# Patient Record
Sex: Male | Born: 1945 | Race: White | Hispanic: No | Marital: Married
Health system: Southern US, Community
[De-identification: ages and names within clinical notes are randomized; demographics above are authoritative.]

## PROBLEM LIST (undated history)

## (undated) DIAGNOSIS — E785 Hyperlipidemia, unspecified: Secondary | ICD-10-CM

## (undated) DIAGNOSIS — J9621 Acute and chronic respiratory failure with hypoxia: Secondary | ICD-10-CM

## (undated) DIAGNOSIS — J690 Pneumonitis due to inhalation of food and vomit: Secondary | ICD-10-CM

## (undated) DIAGNOSIS — K219 Gastro-esophageal reflux disease without esophagitis: Secondary | ICD-10-CM

## (undated) DIAGNOSIS — I639 Cerebral infarction, unspecified: Secondary | ICD-10-CM

## (undated) HISTORY — PX: TRACHEOSTOMY: SUR1362

---

## 2017-08-01 DIAGNOSIS — I639 Cerebral infarction, unspecified: Secondary | ICD-10-CM | POA: Insufficient documentation

## 2017-08-03 DIAGNOSIS — J690 Pneumonitis due to inhalation of food and vomit: Secondary | ICD-10-CM | POA: Diagnosis present

## 2017-08-10 ENCOUNTER — Other Ambulatory Visit (HOSPITAL_COMMUNITY): Payer: Medicaid Other

## 2017-08-10 ENCOUNTER — Inpatient Hospital Stay
Admission: RE | Admit: 2017-08-10 | Discharge: 2017-09-20 | Disposition: A | Discharge status: Skilled Nursing Facility | Payer: Medicaid Other | Attending: Internal Medicine | Admitting: Internal Medicine

## 2017-08-10 DIAGNOSIS — Z930 Tracheostomy status: Secondary | ICD-10-CM

## 2017-08-10 DIAGNOSIS — R109 Unspecified abdominal pain: Secondary | ICD-10-CM

## 2017-08-10 DIAGNOSIS — Z4659 Encounter for fitting and adjustment of other gastrointestinal appliance and device: Secondary | ICD-10-CM

## 2017-08-10 DIAGNOSIS — Z4689 Encounter for fitting and adjustment of other specified devices: Secondary | ICD-10-CM

## 2017-08-10 DIAGNOSIS — R188 Other ascites: Secondary | ICD-10-CM

## 2017-08-10 DIAGNOSIS — I639 Cerebral infarction, unspecified: Secondary | ICD-10-CM | POA: Diagnosis present

## 2017-08-10 DIAGNOSIS — Z95828 Presence of other vascular implants and grafts: Secondary | ICD-10-CM

## 2017-08-10 DIAGNOSIS — K219 Gastro-esophageal reflux disease without esophagitis: Secondary | ICD-10-CM | POA: Diagnosis present

## 2017-08-10 DIAGNOSIS — Z0189 Encounter for other specified special examinations: Secondary | ICD-10-CM

## 2017-08-10 DIAGNOSIS — Z431 Encounter for attention to gastrostomy: Secondary | ICD-10-CM

## 2017-08-10 DIAGNOSIS — J9621 Acute and chronic respiratory failure with hypoxia: Secondary | ICD-10-CM | POA: Diagnosis present

## 2017-08-10 DIAGNOSIS — K819 Cholecystitis, unspecified: Secondary | ICD-10-CM

## 2017-08-10 DIAGNOSIS — J690 Pneumonitis due to inhalation of food and vomit: Secondary | ICD-10-CM | POA: Diagnosis present

## 2017-08-10 DIAGNOSIS — J9690 Respiratory failure, unspecified, unspecified whether with hypoxia or hypercapnia: Secondary | ICD-10-CM

## 2017-08-10 HISTORY — DX: Gastro-esophageal reflux disease without esophagitis: K21.9

## 2017-08-10 HISTORY — DX: Pneumonitis due to inhalation of food and vomit: J69.0

## 2017-08-10 HISTORY — DX: Hyperlipidemia, unspecified: E78.5

## 2017-08-10 HISTORY — DX: Acute and chronic respiratory failure with hypoxia: J96.21

## 2017-08-10 HISTORY — DX: Cerebral infarction, unspecified: I63.9

## 2017-08-11 LAB — CBC
HEMATOCRIT: 47.1 % (ref 39.0–52.0)
HEMOGLOBIN: 16.1 g/dL (ref 13.0–17.0)
MCH: 30.9 pg (ref 26.0–34.0)
MCHC: 34.2 g/dL (ref 30.0–36.0)
MCV: 90.4 fL (ref 78.0–100.0)
Platelets: 220 10*3/uL (ref 150–400)
RBC: 5.21 MIL/uL (ref 4.22–5.81)
RDW: 13.4 % (ref 11.5–15.5)
WBC: 10.5 10*3/uL (ref 4.0–10.5)

## 2017-08-11 LAB — COMPREHENSIVE METABOLIC PANEL
ALK PHOS: 69 U/L (ref 38–126)
ALT: 29 U/L (ref 17–63)
ANION GAP: 12 (ref 5–15)
AST: 23 U/L (ref 15–41)
Albumin: 3.3 g/dL — ABNORMAL LOW (ref 3.5–5.0)
BUN: 28 mg/dL — AB (ref 6–20)
CALCIUM: 9 mg/dL (ref 8.9–10.3)
CO2: 22 mmol/L (ref 22–32)
Chloride: 106 mmol/L (ref 101–111)
Creatinine, Ser: 0.79 mg/dL (ref 0.61–1.24)
GFR calc Af Amer: >60 mL/min (ref >60)
GFR calc non Af Amer: >60 mL/min (ref >60)
Glucose, Bld: 126 mg/dL — ABNORMAL HIGH (ref 65–99)
Potassium: 3.8 mmol/L (ref 3.5–5.1)
SODIUM: 140 mmol/L (ref 135–145)
Total Bilirubin: 1.1 mg/dL (ref 0.3–1.2)
Total Protein: 6.2 g/dL — ABNORMAL LOW (ref 6.5–8.1)

## 2017-08-11 LAB — PROTIME-INR
INR: 1.09
PROTHROMBIN TIME: 14 s (ref 11.4–15.2)

## 2017-08-12 MED ORDER — PRAVASTATIN SODIUM 40 MG PO TABS
40.00 | ORAL_TABLET | ORAL | Status: DC
Start: 2017-08-10 — End: 2017-08-12

## 2017-08-12 MED ORDER — LIDOCAINE HCL URETHRAL/MUCOSAL 2 % EX GEL
CUTANEOUS | Status: DC
Start: ? — End: 2017-08-12

## 2017-08-12 MED ORDER — HEPARIN SODIUM (PORCINE) 5000 UNIT/ML IJ SOLN
5000.00 | INTRAMUSCULAR | Status: DC
Start: 2017-08-10 — End: 2017-08-12

## 2017-08-12 MED ORDER — SODIUM CHLORIDE 3 % IN NEBU
4.00 | INHALATION_SOLUTION | RESPIRATORY_TRACT | Status: DC
Start: 2017-08-10 — End: 2017-08-12

## 2017-08-12 MED ORDER — FUROSEMIDE 20 MG PO TABS
20.00 | ORAL_TABLET | ORAL | Status: DC
Start: 2017-08-11 — End: 2017-08-12

## 2017-08-12 MED ORDER — POTASSIUM CHLORIDE 20 MEQ/15ML (10%) PO SOLN
20.00 | ORAL | Status: DC
Start: ? — End: 2017-08-12

## 2017-08-12 MED ORDER — ONDANSETRON HCL 4 MG/2ML IJ SOLN
4.00 | INTRAMUSCULAR | Status: DC
Start: ? — End: 2017-08-12

## 2017-08-12 MED ORDER — HYDRALAZINE HCL 20 MG/ML IJ SOLN
10.00 | INTRAMUSCULAR | Status: DC
Start: ? — End: 2017-08-12

## 2017-08-12 MED ORDER — GENERIC EXTERNAL MEDICATION
0.04 | Status: DC
Start: ? — End: 2017-08-12

## 2017-08-12 MED ORDER — LORATADINE 10 MG PO TABS
20.00 | ORAL_TABLET | ORAL | Status: DC
Start: 2017-08-11 — End: 2017-08-12

## 2017-08-12 MED ORDER — VITAMIN D3 25 MCG (1000 UNIT) PO TABS
1000.00 | ORAL_TABLET | ORAL | Status: DC
Start: 2017-08-11 — End: 2017-08-12

## 2017-08-12 MED ORDER — GUAIFENESIN 100 MG/5ML PO LIQD
400.00 | ORAL | Status: DC
Start: 2017-08-10 — End: 2017-08-12

## 2017-08-12 MED ORDER — ACETAMINOPHEN 160 MG/5ML PO SUSP
650.00 | ORAL | Status: DC
Start: ? — End: 2017-08-12

## 2017-08-12 MED ORDER — ASPIRIN 300 MG RE SUPP
300.00 | RECTAL | Status: DC
Start: 2017-08-11 — End: 2017-08-12

## 2017-08-12 MED ORDER — SODIUM CHLORIDE 0.9 % IV SOLN
10.00 | INTRAVENOUS | Status: DC
Start: ? — End: 2017-08-12

## 2017-08-12 MED ORDER — GENERIC EXTERNAL MEDICATION
.08 | Status: DC
Start: ? — End: 2017-08-12

## 2017-08-12 MED ORDER — RANITIDINE HCL 15 MG/ML PO SYRP
150.00 | ORAL_SOLUTION | ORAL | Status: DC
Start: 2017-08-10 — End: 2017-08-12

## 2017-08-12 MED ORDER — ALUM & MAG HYDROXIDE-SIMETH 200-200-20 MG/5ML PO SUSP
30.00 | ORAL | Status: DC
Start: ? — End: 2017-08-12

## 2017-08-12 MED ORDER — POTASSIUM CHLORIDE 20 MEQ/50ML IV SOLN
20.00 | INTRAVENOUS | Status: DC
Start: ? — End: 2017-08-12

## 2017-08-12 MED ORDER — POTASSIUM CHLORIDE CRYS ER 20 MEQ PO TBCR
20.00 | EXTENDED_RELEASE_TABLET | ORAL | Status: DC
Start: ? — End: 2017-08-12

## 2017-08-12 MED ORDER — LABETALOL HCL 5 MG/ML IV SOLN
10.00 | INTRAVENOUS | Status: DC
Start: ? — End: 2017-08-12

## 2017-08-13 ENCOUNTER — Other Ambulatory Visit (HOSPITAL_COMMUNITY): Payer: Medicaid Other

## 2017-08-13 MED ORDER — LIDOCAINE VISCOUS 2 % MT SOLN
OROMUCOSAL | Status: AC
  Filled 2017-08-13: qty 15

## 2017-08-13 MED ORDER — IOPAMIDOL (ISOVUE-300) INJECTION 61%
INTRAVENOUS | Status: AC
  Filled 2017-08-13: qty 50

## 2017-08-15 ENCOUNTER — Other Ambulatory Visit (HOSPITAL_COMMUNITY): Payer: Medicaid Other

## 2017-08-15 LAB — COMPREHENSIVE METABOLIC PANEL
ALT: 82 U/L — ABNORMAL HIGH (ref 17–63)
ANION GAP: 10 (ref 5–15)
AST: 38 U/L (ref 15–41)
Albumin: 2.8 g/dL — ABNORMAL LOW (ref 3.5–5.0)
Alkaline Phosphatase: 77 U/L (ref 38–126)
BUN: 23 mg/dL — ABNORMAL HIGH (ref 6–20)
CHLORIDE: 99 mmol/L — AB (ref 101–111)
CO2: 27 mmol/L (ref 22–32)
Calcium: 8.6 mg/dL — ABNORMAL LOW (ref 8.9–10.3)
Creatinine, Ser: 0.87 mg/dL (ref 0.61–1.24)
GFR calc Af Amer: >60 mL/min (ref >60)
Glucose, Bld: 140 mg/dL — ABNORMAL HIGH (ref 65–99)
POTASSIUM: 3.1 mmol/L — AB (ref 3.5–5.1)
SODIUM: 136 mmol/L (ref 135–145)
Total Bilirubin: 0.7 mg/dL (ref 0.3–1.2)
Total Protein: 6.2 g/dL — ABNORMAL LOW (ref 6.5–8.1)

## 2017-08-15 LAB — TRIGLYCERIDES: TRIGLYCERIDES: 88 mg/dL (ref <150)

## 2017-08-16 LAB — COMPREHENSIVE METABOLIC PANEL
ALT: 110 U/L — AB (ref 17–63)
AST: 58 U/L — ABNORMAL HIGH (ref 15–41)
Albumin: 2.6 g/dL — ABNORMAL LOW (ref 3.5–5.0)
Alkaline Phosphatase: 85 U/L (ref 38–126)
Anion gap: 9 (ref 5–15)
BUN: 21 mg/dL — ABNORMAL HIGH (ref 6–20)
CHLORIDE: 100 mmol/L — AB (ref 101–111)
CO2: 28 mmol/L (ref 22–32)
CREATININE: 0.81 mg/dL (ref 0.61–1.24)
Calcium: 8.7 mg/dL — ABNORMAL LOW (ref 8.9–10.3)
GFR calc non Af Amer: >60 mL/min (ref >60)
Glucose, Bld: 143 mg/dL — ABNORMAL HIGH (ref 65–99)
POTASSIUM: 3.8 mmol/L (ref 3.5–5.1)
Sodium: 137 mmol/L (ref 135–145)
Total Bilirubin: 1.1 mg/dL (ref 0.3–1.2)
Total Protein: 5.9 g/dL — ABNORMAL LOW (ref 6.5–8.1)

## 2017-08-16 LAB — POTASSIUM: Potassium: 4 mmol/L (ref 3.5–5.1)

## 2017-08-16 LAB — CBC
HEMATOCRIT: 43.1 % (ref 39.0–52.0)
Hemoglobin: 14.8 g/dL (ref 13.0–17.0)
MCH: 31 pg (ref 26.0–34.0)
MCHC: 34.3 g/dL (ref 30.0–36.0)
MCV: 90.2 fL (ref 78.0–100.0)
PLATELETS: 272 10*3/uL (ref 150–400)
RBC: 4.78 MIL/uL (ref 4.22–5.81)
RDW: 13.1 % (ref 11.5–15.5)
WBC: 8 10*3/uL (ref 4.0–10.5)

## 2017-08-16 LAB — PROTIME-INR
INR: 1.16
Prothrombin Time: 14.7 seconds (ref 11.4–15.2)

## 2017-08-16 LAB — GAMMA GT: GGT: 64 U/L — AB (ref 7–50)

## 2017-08-16 MED ORDER — FENTANYL CITRATE (PF) 100 MCG/2ML IJ SOLN
INTRAMUSCULAR | Status: AC
  Filled 2017-08-16: qty 2

## 2017-08-16 MED ORDER — MIDAZOLAM HCL 2 MG/2ML IJ SOLN
INTRAMUSCULAR | Status: AC
  Filled 2017-08-16: qty 2

## 2017-08-16 NOTE — Progress Notes (Signed)
Called RN to inform him that we will not get pt due to time constraints in IR schedule. Informed RN that we will attempt to bring pt down tomorrow (5/8) for g tube placement.

## 2017-08-16 NOTE — Consult Note (Signed)
Chief Complaint: Patient was seen in consultation today for percutaneous gastric tub placement at the request of Dr Ardeth Sportsman  Supervising Physician: Ruel Favors  Patient Status: Select IP  History of Present Illness: Isaac Abbott is a 72 y.o. male   CVA Dysphagia Malnutrition Need for long term care Request for percutaneous gastric tube placement Approved by Dr Garnet Koyanagi  No past medical history on file.  Allergies: Patient has no allergy information on record.  Medications: Prior to Admission medications   Not on File     No family history on file.  Social History   Socioeconomic History  . Marital status: Not on file    Spouse name: Not on file  . Number of children: Not on file  . Years of education: Not on file  . Highest education level: Not on file  Occupational History  . Not on file  Social Needs  . Financial resource strain: Not on file  . Food insecurity:    Worry: Not on file    Inability: Not on file  . Transportation needs:    Medical: Not on file    Non-medical: Not on file  Tobacco Use  . Smoking status: Not on file  Substance and Sexual Activity  . Alcohol use: Not on file  . Drug use: Not on file  . Sexual activity: Not on file  Lifestyle  . Physical activity:    Days per week: Not on file    Minutes per session: Not on file  . Stress: Not on file  Relationships  . Social connections:    Talks on phone: Not on file    Gets together: Not on file    Attends religious service: Not on file    Active member of club or organization: Not on file    Attends meetings of clubs or organizations: Not on file    Relationship status: Not on file  Other Topics Concern  . Not on file  Social History Narrative  . Not on file    Review of Systems: A 12 point ROS discussed and pertinent positives are indicated in the HPI above.  All other systems are negative.  Review of Systems  Constitutional: Positive for activity change and  appetite change. Negative for fever.  Cardiovascular: Negative for chest pain.  Neurological: Positive for weakness.  Psychiatric/Behavioral: Positive for confusion.    Vital Signs: There were no vitals taken for this visit.  Physical Exam  Cardiovascular: Normal rate and regular rhythm.  Pulmonary/Chest: He has wheezes.  Abdominal: Soft. Bowel sounds are normal.  Musculoskeletal:  Pt tries to communicate Using "thumbs up" sign for answers  Neurological: He is alert.  Skin: Skin is warm and dry.  Psychiatric: He has a normal mood and affect.  Consented with wife at bedside  Nursing note and vitals reviewed.   Imaging: Ct Abdomen Wo Contrast  Result Date: 08/15/2017 CLINICAL DATA:  72 year old male with dysphagia and suspected aspiration undergoing evaluation for possible percutaneous gastrostomy tube placement. Evaluate anatomy. EXAM: CT ABDOMEN WITHOUT CONTRAST TECHNIQUE: Multidetector CT imaging of the abdomen was performed following the standard protocol without IV contrast. COMPARISON:  None. FINDINGS: Lower chest: Calcifications are present throughout the coronary arteries. The heart is normal in size. No pericardial effusion. Small hiatal hernia. Debris is present within the bronchus intermedius. Additionally, there are patchy areas of secretions and/or debris within the right lower lobe segmental bronchi. Patchy airspace opacity is present in the posterior aspect of the right  upper, middle and lower lobes and to a lesser degree in the posterior aspect of the left lower lobe. Findings are concerning for aspiration. No suspicious pulmonary mass or nodule. No significant pleural effusion. Hepatobiliary: Normal hepatic contour and morphology. No discrete hepatic lesions. Normal appearance of the gallbladder. No intra or extrahepatic biliary ductal dilatation. Pancreas: Unremarkable. No pancreatic ductal dilatation or surrounding inflammatory changes. Spleen: Normal in size without focal  abnormality. Adrenals/Urinary Tract: Adrenal glands are unremarkable. Kidneys are normal, without renal calculi, focal lesion, or hydronephrosis. Stomach/Bowel: Normal gastric anatomy. Small periampullary duodenal diverticulum. Colonic diverticular disease without CT evidence of active inflammation. Normal position of the transverse colon inferior to the stomach. Vascular/Lymphatic: Limited evaluation in the absence of intravenous contrast. Atherosclerotic calcifications throughout the abdominal aorta. Suspect bilateral, left greater than right common iliac stenosis. No evidence of aneurysm. No suspicious lymphadenopathy. Other: No abdominal wall hernia or abnormality. Musculoskeletal: No acute or significant osseous findings. IMPRESSION: 1. Anatomy is amenable to percutaneous gastrostomy tube placement. 2. Multifocal patchy airspace opacity and bronchial debris, particularly in the right lower lobe concerning for aspiration. 3. Colonic diverticular disease without CT evidence of active inflammation. 4. Coronary artery calcifications. 5.  Aortic Atherosclerosis (ICD10-170.0) 6. Suspect bilateral, left greater than right, common iliac artery stenoses. Signed, Sterling Big, MD [facility] Electronically Signed   By: Malachy Moan M.D.   On: 08/15/2017 13:47   Dg Chest Port 1 View  Result Date: 08/13/2017 CLINICAL DATA:  PICC line placement EXAM: PORTABLE CHEST 1 VIEW COMPARISON:  08/10/2017 FINDINGS: Right upper extremity central venous catheter tip overlies the cavoatrial region. Removal of esophageal tube and left-sided central venous catheter. Stable enlarged cardiomediastinal silhouette. Patchy right greater than left lower lung infiltrates. Aortic atherosclerosis. No pneumothorax. IMPRESSION: 1. Right upper extremity catheter tip overlies the cavoatrial region 2. Right greater than left bilateral lower lung pulmonary infiltrates.  Electronically Signed   By: Jasmine Pang M.D.   On: 08/13/2017 20:58   Dg Chest Port 1 View  Result Date: 08/10/2017 CLINICAL DATA:  Shortness of breath, congestion EXAM: PORTABLE CHEST 1 VIEW COMPARISON:  None. FINDINGS: Mild bilateral lower lobe opacities, likely atelectasis. No frank interstitial edema. No pleural effusion or pneumothorax. Cardiomegaly. Right arm PICC terminates at the cavoatrial junction. Enteric tube courses into the duodenum. A catheter/tube overlies the left chest, this is favored to reflect the external portion of the enteric tube. IMPRESSION: Mild bilateral lower lobe opacities, likely atelectasis. Electronically Signed   By: Charline Bills M.D.   On: 08/10/2017 18:35   Dg Abd Portable 1v  Result Date: 08/10/2017 CLINICAL DATA:  Evaluate NG tube position EXAM: PORTABLE ABDOMEN - 1 VIEW COMPARISON:  None. FINDINGS: Enteric tube terminates in the proximal 3rd portion of the duodenum. Nonobstructive bowel gas pattern. Contrast in the left colon. IMPRESSION: Enteric tube terminates in the proximal 3rd portion of the duodenum. Electronically Signed   By: Charline Bills M.D.   On: 08/10/2017 18:34   Dg Vangie Bicker G Tube Plc W/fl-no Rad  Result Date: 08/13/2017 CLINICAL DATA:  Inpatient. Post pyloric feeding tube placement requested. EXAM: DG NASO G TUBE PLC W/FL-NO RAD CONTRAST:  None. FLUOROSCOPY TIME:  Fluoroscopy Time:  5 minutes 0 seconds Radiation Exposure Index (if provided by the fluoroscopic device): 37.5 mGy Number of Acquired Spot Images: 0 COMPARISON:  08/10/2017 chest and abdomen radiograph FINDINGS: Despite multiple attempts in multiple patient positions utilizing multiple maneuvers including chin tuck and wet sponge, and  utilizing a wire stiffener, nasoenteric tube placement was technically unsuccessful, as the tube entered the trachea on all attempts. IMPRESSION: Technically unsuccessful attempted nasoenteric tube placement. These results were called by telephone at the  time of interpretation on 08/13/2017 at 4:04 pm to Dr. Carron Curie , who verbally acknowledged these results. Electronically Signed   By: Delbert Phenix M.D.   On: 08/13/2017 16:17    Labs:  CBC: Recent Labs    08/11/17 0656  WBC 10.5  HGB 16.1  HCT 47.1  PLT 220    COAGS: Recent Labs    08/11/17 0656  INR 1.09    BMP: Recent Labs    08/11/17 0656 08/15/17 0509  NA 140 136  K 3.8 3.1*  CL 106 99*  CO2 22 27  GLUCOSE 126* 140*  BUN 28* 23*  CALCIUM 9.0 8.6*  CREATININE 0.79 0.87  GFRNONAA >60 >60  GFRAA >60 >60    LIVER FUNCTION TESTS: Recent Labs    08/11/17 0656 08/15/17 0509  BILITOT 1.1 0.7  AST 23 38  ALT 29 82*  ALKPHOS 69 77  PROT 6.2* 6.2*  ALBUMIN 3.3* 2.8*    TUMOR MARKERS: No results for input(s): AFPTM, CEA, CA199, CHROMGRNA in the last 8760 hours.  Assessment and Plan:  CVA/dysphagia Malnutrition Deconditioning Long term care Scheduled for percutaneous gastric tube placement in IR 5/7 Risks and benefits discussed with the patient and wife including, but not limited to the need for a barium enema during the procedure, bleeding, infection, peritonitis, or damage to adjacent structures.  All of their questions were answered, patient is agreeable to proceed. Consent signed and in chart.   Thank you for this interesting consult.  I greatly enjoyed meeting Saturnino Liew and look forward to participating in their care.  A copy of this report was sent to the requesting provider on this date.  Electronically Signed: Robet Leu, PA-C 08/16/2017, 6:06 AM   I spent a total of 40 Minutes    in face to face in clinical consultation, greater than 50% of which was counseling/coordinating care for percutaneous gastric tube placement

## 2017-08-17 ENCOUNTER — Encounter (HOSPITAL_COMMUNITY): Payer: Self-pay | Admitting: Interventional Radiology

## 2017-08-17 ENCOUNTER — Other Ambulatory Visit (HOSPITAL_COMMUNITY): Payer: Medicaid Other

## 2017-08-17 HISTORY — PX: IR GASTROSTOMY TUBE MOD SED: IMG625

## 2017-08-17 LAB — COMPREHENSIVE METABOLIC PANEL
ALBUMIN: 2.8 g/dL — AB (ref 3.5–5.0)
ALT: 132 U/L — AB (ref 17–63)
ANION GAP: 8 (ref 5–15)
AST: 57 U/L — ABNORMAL HIGH (ref 15–41)
Alkaline Phosphatase: 95 U/L (ref 38–126)
BUN: 19 mg/dL (ref 6–20)
CALCIUM: 8.8 mg/dL — AB (ref 8.9–10.3)
CO2: 29 mmol/L (ref 22–32)
CREATININE: 0.81 mg/dL (ref 0.61–1.24)
Chloride: 100 mmol/L — ABNORMAL LOW (ref 101–111)
GFR calc Af Amer: >60 mL/min (ref >60)
GFR calc non Af Amer: >60 mL/min (ref >60)
Glucose, Bld: 128 mg/dL — ABNORMAL HIGH (ref 65–99)
Potassium: 4.1 mmol/L (ref 3.5–5.1)
SODIUM: 137 mmol/L (ref 135–145)
TOTAL PROTEIN: 6.2 g/dL — AB (ref 6.5–8.1)
Total Bilirubin: 1 mg/dL (ref 0.3–1.2)

## 2017-08-17 MED ORDER — IOPAMIDOL (ISOVUE-300) INJECTION 61%
INTRAVENOUS | Status: AC
  Administered 2017-08-17: 10 mL
  Filled 2017-08-17: qty 50

## 2017-08-17 MED ORDER — CEFAZOLIN SODIUM-DEXTROSE 2-4 GM/100ML-% IV SOLN
2.0000 g | Freq: Once | INTRAVENOUS | Status: AC
  Administered 2017-08-17: 2 g via INTRAVENOUS
  Filled 2017-08-17: qty 100

## 2017-08-17 MED ORDER — LIDOCAINE HCL (PF) 1 % IJ SOLN
INTRAMUSCULAR | Status: AC | PRN
  Administered 2017-08-17: 5 mL

## 2017-08-17 MED ORDER — FENTANYL CITRATE (PF) 100 MCG/2ML IJ SOLN
INTRAMUSCULAR | Status: AC
  Filled 2017-08-17: qty 2

## 2017-08-17 MED ORDER — FENTANYL CITRATE (PF) 100 MCG/2ML IJ SOLN
INTRAMUSCULAR | Status: AC | PRN
  Administered 2017-08-17: 50 ug via INTRAVENOUS

## 2017-08-17 MED ORDER — MIDAZOLAM HCL 2 MG/2ML IJ SOLN
INTRAMUSCULAR | Status: AC
  Filled 2017-08-17: qty 2

## 2017-08-17 MED ORDER — MIDAZOLAM HCL 2 MG/2ML IJ SOLN
INTRAMUSCULAR | Status: AC | PRN
  Administered 2017-08-17 (×2): 1 mg via INTRAVENOUS

## 2017-08-17 MED ORDER — GLUCAGON HCL RDNA (DIAGNOSTIC) 1 MG IJ SOLR
INTRAMUSCULAR | Status: AC
  Filled 2017-08-17: qty 1

## 2017-08-17 MED ORDER — GLUCAGON HCL RDNA (DIAGNOSTIC) 1 MG IJ SOLR
INTRAMUSCULAR | Status: AC | PRN
  Administered 2017-08-17: 1 mg via INTRAVENOUS

## 2017-08-17 MED ORDER — LIDOCAINE HCL 1 % IJ SOLN
INTRAMUSCULAR | Status: AC
  Filled 2017-08-17: qty 20

## 2017-08-17 NOTE — Sedation Documentation (Signed)
Patient is resting comfortably. 

## 2017-08-17 NOTE — Procedures (Signed)
18 Fr balloon retention G tube EBL 0 Comp 0

## 2017-08-17 NOTE — Sedation Documentation (Addendum)
Patient is resting comfortably. 

## 2017-08-18 ENCOUNTER — Other Ambulatory Visit (HOSPITAL_COMMUNITY): Payer: Medicaid Other

## 2017-08-20 LAB — HEPATIC FUNCTION PANEL
ALK PHOS: 146 U/L — AB (ref 38–126)
ALT: 155 U/L — AB (ref 17–63)
AST: 59 U/L — ABNORMAL HIGH (ref 15–41)
Albumin: 2.8 g/dL — ABNORMAL LOW (ref 3.5–5.0)
BILIRUBIN DIRECT: 0.2 mg/dL (ref 0.1–0.5)
BILIRUBIN INDIRECT: 0.5 mg/dL (ref 0.3–0.9)
BILIRUBIN TOTAL: 0.7 mg/dL (ref 0.3–1.2)
Total Protein: 6.3 g/dL — ABNORMAL LOW (ref 6.5–8.1)

## 2017-08-20 LAB — URINALYSIS, ROUTINE W REFLEX MICROSCOPIC
Bacteria, UA: NONE SEEN
Bilirubin Urine: NEGATIVE
GLUCOSE, UA: NEGATIVE mg/dL
KETONES UR: NEGATIVE mg/dL
Leukocytes, UA: NEGATIVE
Nitrite: NEGATIVE
PH: 6 (ref 5.0–8.0)
Protein, ur: NEGATIVE mg/dL
SPECIFIC GRAVITY, URINE: 1.015 (ref 1.005–1.030)

## 2017-08-21 ENCOUNTER — Encounter: Payer: Self-pay | Admitting: Certified Registered Nurse Anesthetist

## 2017-08-21 NOTE — Anesthesia Preprocedure Evaluation (Addendum)
Anesthesia Evaluation  Patient identified by MRN, date of birth, ID band Patient confused    Reviewed: Allergy & Precautions, NPO status , Patient's Chart, lab work & pertinent test results  History of Anesthesia Complications Negative for: history of anesthetic complications  Airway Mallampati: II  TM Distance: >3 FB Neck ROM: Full    Dental  (+) Edentulous Upper, Edentulous Lower   Pulmonary  Resp failure self extubated, chronic aspiration   + rhonchi        Cardiovascular hypertension, Normal cardiovascular exam     Neuro/Psych CVA negative neurological ROS  negative psych ROS   GI/Hepatic Neg liver ROS, Gastric tube   Endo/Other  negative endocrine ROS  Renal/GU negative Renal ROS     Musculoskeletal negative musculoskeletal ROS (+)   Abdominal   Peds  Hematology negative hematology ROS (+)   Anesthesia Other Findings Day of surgery medications reviewed with the patient.  Reproductive/Obstetrics                            Anesthesia Physical Anesthesia Plan  ASA: IV  Anesthesia Plan: General   Post-op Pain Management:    Induction: Intravenous  PONV Risk Score and Plan: Treatment may vary due to age or medical condition  Airway Management Planned: Oral ETT  Additional Equipment:   Intra-op Plan:   Post-operative Plan: Post-operative intubation/ventilation  Informed Consent: I have reviewed the patients History and Physical, chart, labs and discussed the procedure including the risks, benefits and alternatives for the proposed anesthesia with the patient or authorized representative who has indicated his/her understanding and acceptance.   Consent reviewed with POA  Plan Discussed with: CRNA and Anesthesiologist  Anesthesia Plan Comments:        Anesthesia Quick Evaluation

## 2017-08-22 ENCOUNTER — Encounter (HOSPITAL_COMMUNITY): Payer: Medicaid Other | Admitting: Certified Registered Nurse Anesthetist

## 2017-08-22 ENCOUNTER — Encounter: Payer: Self-pay | Admitting: Internal Medicine

## 2017-08-22 ENCOUNTER — Encounter
Admission: RE | Disposition: A | Discharge status: Skilled Nursing Facility | Payer: Self-pay | Source: Home / Self Care | Attending: Internal Medicine

## 2017-08-22 DIAGNOSIS — I6359 Cerebral infarction due to unspecified occlusion or stenosis of other cerebral artery: Secondary | ICD-10-CM

## 2017-08-22 DIAGNOSIS — Z930 Tracheostomy status: Secondary | ICD-10-CM

## 2017-08-22 DIAGNOSIS — J690 Pneumonitis due to inhalation of food and vomit: Secondary | ICD-10-CM

## 2017-08-22 DIAGNOSIS — K219 Gastro-esophageal reflux disease without esophagitis: Secondary | ICD-10-CM | POA: Diagnosis present

## 2017-08-22 DIAGNOSIS — J9621 Acute and chronic respiratory failure with hypoxia: Secondary | ICD-10-CM | POA: Diagnosis not present

## 2017-08-22 DIAGNOSIS — I639 Cerebral infarction, unspecified: Secondary | ICD-10-CM | POA: Diagnosis present

## 2017-08-22 HISTORY — PX: TRACHEOSTOMY TUBE PLACEMENT: SHX814

## 2017-08-22 LAB — COMPREHENSIVE METABOLIC PANEL
ALBUMIN: 3 g/dL — AB (ref 3.5–5.0)
ALT: 123 U/L — AB (ref 17–63)
AST: 49 U/L — AB (ref 15–41)
Alkaline Phosphatase: 127 U/L — ABNORMAL HIGH (ref 38–126)
Anion gap: 10 (ref 5–15)
BILIRUBIN TOTAL: 0.7 mg/dL (ref 0.3–1.2)
BUN: 18 mg/dL (ref 6–20)
CHLORIDE: 99 mmol/L — AB (ref 101–111)
CO2: 27 mmol/L (ref 22–32)
Calcium: 9.3 mg/dL (ref 8.9–10.3)
Creatinine, Ser: 0.89 mg/dL (ref 0.61–1.24)
GFR calc Af Amer: >60 mL/min (ref >60)
GLUCOSE: 110 mg/dL — AB (ref 65–99)
POTASSIUM: 4.8 mmol/L (ref 3.5–5.1)
Sodium: 136 mmol/L (ref 135–145)
TOTAL PROTEIN: 6.4 g/dL — AB (ref 6.5–8.1)

## 2017-08-22 SURGERY — CREATION, TRACHEOSTOMY
Anesthesia: General

## 2017-08-22 MED ORDER — LIDOCAINE-EPINEPHRINE 1 %-1:100000 IJ SOLN
INTRAMUSCULAR | Status: AC
Start: 2017-08-22 — End: ?
  Filled 2017-08-22: qty 1

## 2017-08-22 MED ORDER — PROPOFOL 10 MG/ML IV BOLUS
INTRAVENOUS | Status: DC | PRN
  Administered 2017-08-22: 110 mg via INTRAVENOUS

## 2017-08-22 MED ORDER — SUGAMMADEX SODIUM 200 MG/2ML IV SOLN
INTRAVENOUS | Status: DC | PRN
  Administered 2017-08-22: 200 mg via INTRAVENOUS

## 2017-08-22 MED ORDER — EPHEDRINE SULFATE 50 MG/ML IJ SOLN
INTRAMUSCULAR | Status: AC
  Filled 2017-08-22: qty 1

## 2017-08-22 MED ORDER — LIDOCAINE-EPINEPHRINE 1 %-1:100000 IJ SOLN
INTRAMUSCULAR | Status: DC | PRN
  Administered 2017-08-22: 5.5 mL

## 2017-08-22 MED ORDER — LACTATED RINGERS IV SOLN
INTRAVENOUS | Status: DC | PRN
  Administered 2017-08-22: 07:00:00 via INTRAVENOUS

## 2017-08-22 MED ORDER — 0.9 % SODIUM CHLORIDE (POUR BTL) OPTIME
TOPICAL | Status: DC | PRN
  Administered 2017-08-22: 1000 mL

## 2017-08-22 MED ORDER — SUCCINYLCHOLINE CHLORIDE 200 MG/10ML IV SOSY
PREFILLED_SYRINGE | INTRAVENOUS | Status: DC | PRN
  Administered 2017-08-22: 120 mg via INTRAVENOUS

## 2017-08-22 MED ORDER — DEXAMETHASONE SODIUM PHOSPHATE 10 MG/ML IJ SOLN
INTRAMUSCULAR | Status: DC | PRN
  Administered 2017-08-22: 10 mg via INTRAVENOUS

## 2017-08-22 MED ORDER — FENTANYL CITRATE (PF) 250 MCG/5ML IJ SOLN
INTRAMUSCULAR | Status: AC
  Filled 2017-08-22: qty 5

## 2017-08-22 MED ORDER — LIDOCAINE HCL (CARDIAC) PF 100 MG/5ML IV SOSY
PREFILLED_SYRINGE | INTRAVENOUS | Status: DC | PRN
  Administered 2017-08-22: 60 mg via INTRAVENOUS

## 2017-08-22 MED ORDER — MIDAZOLAM HCL 2 MG/2ML IJ SOLN
INTRAMUSCULAR | Status: AC
Start: 2017-08-22 — End: ?
  Filled 2017-08-22: qty 2

## 2017-08-22 MED ORDER — ROCURONIUM BROMIDE 100 MG/10ML IV SOLN
INTRAVENOUS | Status: DC | PRN
  Administered 2017-08-22: 50 mg via INTRAVENOUS

## 2017-08-22 MED ORDER — ONDANSETRON HCL 4 MG/2ML IJ SOLN
INTRAMUSCULAR | Status: DC | PRN
  Administered 2017-08-22: 4 mg via INTRAVENOUS

## 2017-08-22 MED ORDER — PROPOFOL 10 MG/ML IV BOLUS
INTRAVENOUS | Status: AC
  Filled 2017-08-22: qty 20

## 2017-08-22 MED ORDER — FENTANYL CITRATE (PF) 100 MCG/2ML IJ SOLN
INTRAMUSCULAR | Status: DC | PRN
  Administered 2017-08-22: 50 ug via INTRAVENOUS

## 2017-08-22 MED ORDER — ROCURONIUM BROMIDE 50 MG/5ML IV SOLN
INTRAVENOUS | Status: AC
  Filled 2017-08-22: qty 1

## 2017-08-22 SURGICAL SUPPLY — 38 items
ATTRACTOMAT 16X20 MAGNETIC DRP (DRAPES) IMPLANT
BLADE SURG 15 STRL LF DISP TIS (BLADE) ×1 IMPLANT
BLADE SURG 15 STRL SS (BLADE) ×2
CLEANER TIP ELECTROSURG 2X2 (MISCELLANEOUS) ×3 IMPLANT
COVER SURGICAL LIGHT HANDLE (MISCELLANEOUS) ×3 IMPLANT
DRAPE HALF SHEET 40X57 (DRAPES) IMPLANT
ELECT COATED BLADE 2.86 ST (ELECTRODE) ×3 IMPLANT
ELECT REM PT RETURN 9FT ADLT (ELECTROSURGICAL) ×3
ELECTRODE REM PT RTRN 9FT ADLT (ELECTROSURGICAL) ×1 IMPLANT
GAUZE SPONGE 4X4 16PLY XRAY LF (GAUZE/BANDAGES/DRESSINGS) ×3 IMPLANT
GEL ULTRASOUND 20GR AQUASONIC (MISCELLANEOUS) ×3 IMPLANT
GLOVE SS BIOGEL STRL SZ 7.5 (GLOVE) ×1 IMPLANT
GLOVE SUPERSENSE BIOGEL SZ 7.5 (GLOVE) ×2
GOWN STRL REUS W/ TWL LRG LVL3 (GOWN DISPOSABLE) ×1 IMPLANT
GOWN STRL REUS W/ TWL XL LVL3 (GOWN DISPOSABLE) ×1 IMPLANT
GOWN STRL REUS W/TWL LRG LVL3 (GOWN DISPOSABLE) ×2
GOWN STRL REUS W/TWL XL LVL3 (GOWN DISPOSABLE) ×2
HOLDER TRACH TUBE VELCRO 19.5 (MISCELLANEOUS) ×3 IMPLANT
KIT BASIN OR (CUSTOM PROCEDURE TRAY) ×3 IMPLANT
KIT SUCTION CATH 14FR (SUCTIONS) ×3 IMPLANT
KIT TURNOVER KIT B (KITS) ×3 IMPLANT
NEEDLE HYPO 25GX1X1/2 BEV (NEEDLE) ×3 IMPLANT
NS IRRIG 1000ML POUR BTL (IV SOLUTION) ×3 IMPLANT
PACK EENT II TURBAN DRAPE (CUSTOM PROCEDURE TRAY) ×3 IMPLANT
PAD ARMBOARD 7.5X6 YLW CONV (MISCELLANEOUS) IMPLANT
PENCIL BUTTON HOLSTER BLD 10FT (ELECTRODE) ×3 IMPLANT
SPONGE DRAIN TRACH 4X4 STRL 2S (GAUZE/BANDAGES/DRESSINGS) ×3 IMPLANT
SPONGE INTESTINAL PEANUT (DISPOSABLE) ×3 IMPLANT
SUT SILK 2 0 SH CR/8 (SUTURE) ×3 IMPLANT
SUT SILK 3 0 TIES 10X30 (SUTURE) IMPLANT
SYR 5ML LUER SLIP (SYRINGE) ×3 IMPLANT
SYR CONTROL 10ML LL (SYRINGE) ×3 IMPLANT
TOWEL OR 17X24 6PK STRL BLUE (TOWEL DISPOSABLE) ×3 IMPLANT
TOWEL OR 17X26 10 PK STRL BLUE (TOWEL DISPOSABLE) ×3 IMPLANT
TUBE CONNECTING 12'X1/4 (SUCTIONS) ×1
TUBE CONNECTING 12X1/4 (SUCTIONS) ×2 IMPLANT
TUBE TRACH SHILEY  6 DIST  CUF (TUBING) ×3 IMPLANT
TUBE TRACH SHILEY 8 DIST CUF (TUBING) IMPLANT

## 2017-08-22 NOTE — Op Note (Signed)
Isaac Abbott, PACITTI MEDICAL RECORD [id number] ACCOUNT [id number] DATE OF BIRTH:03/29/1946 FACILITY: [facility] LOCATION: [facility] PHYSICIAN:Kayla Weekes Braxton Feathers, MD  OPERATIVE REPORT  DATE OF PROCEDURE:  08/22/2017  PREOPERATIVE DIAGNOSIS:  Respiratory failure.  POSTOPERATIVE DIAGNOSIS:  Respiratory failure.  OPERATION:  Tracheotomy with a #6 Shiley cuffed tube.  SURGEON:  Narda Bonds, MD  ANESTHESIA:  General endotracheal.  COMPLICATIONS:  None.  ESTIMATED BLOOD LOSS:  Minimal.  BRIEF CLINICAL NOTE:  The patient is a 72 year old gentleman who has had previous history of strokes.  He recently had a stroke about a month ago requiring admission to outside hospital.  He had an aspiration pneumonia requiring intubation.  He has had  difficulty protecting his airway with aspiration problems as well as airway problems.  He has a history of chronic AFib and COPD.  He was subsequently transferred to [facility].  He has again been having chronic problems with protecting  his airway and it was recommended he undergo a tracheotomy.  He is presently not intubated, but is having excessive secretions with trouble protecting his airway.  He has been unable to swallow and takes everything through a gastrostomy tube.  He is  taken to the operating room at this time for tracheotomy.  DESCRIPTION OF PROCEDURE:  The patient was brought to the operating room and underwent general endotracheal anesthesia.  The neck was marked and prepped with Betadine solution.  The proposed incision site was injected with 5-6 mL of Xylocaine with  epinephrine for hemostasis and local anesthetic.  A vertical incision was made just below the cricoid cartilage.  Dissection was carried down through the subcutaneous tissue with cautery.  The strap muscles were identified and divided longitudinally in  the midline and retracted laterally.  The patient had a fairly low cricoid cartilage and the thyroid isthmus  laid over the first and second tracheal rings.  The thyroid isthmus was divided with the cautery.  First 2 tracheal rings were exposed.  Cricoid  hook was used to elevate the trachea slightly.  A horizontal tracheotomy was performed through the first and second tracheal rings.  A small mid portion of the second tracheal ring was removed and the #6 Shiley tracheotomy tube was inserted without  difficulty.  The patient was ventilated well with good sats.  The tracheotomy tube was secured with silk sutures x4 and a Velcro trach collar around the neck.    The patient did well with this and was subsequently transferred back to [facility].  AN/NUANCE  D:08/22/2017 T:08/22/2017 JOB:[id number]

## 2017-08-22 NOTE — Brief Op Note (Signed)
08/22/2017  8:31 AM  PATIENT:  Isaac Abbott  72 y.o. male  PRE-OPERATIVE DIAGNOSIS:  RESPIRATORY FAILURE  POST-OPERATIVE DIAGNOSIS:  RESPIRATORY FAILURE  PROCEDURE:  Procedure(s): TRACHEOSTOMY (N/A)  # 6 Cuffed Shiley  SURGEON:  Surgeon(s) and Role:    Drema Halon, MD - Primary  PHYSICIAN ASSISTANT:   ASSISTANTS: none   ANESTHESIA:   general  EBL:  minimal   BLOOD ADMINISTERED:none  DRAINS: none   LOCAL MEDICATIONS USED:  XYLOCAINE with EPI 6 cc  SPECIMEN:  No Specimen  DISPOSITION OF SPECIMEN:  N/A  COUNTS:  YES  TOURNIQUET:  * No tourniquets in log *  DICTATION: .Other Dictation: Dictation Number [id number]  PLAN OF CARE: Discharge to home after PACU  PATIENT DISPOSITION:  PACU - hemodynamically stable.   Delay start of Pharmacological VTE agent (>24hrs) due to surgical blood loss or risk of bleeding: yes

## 2017-08-22 NOTE — Anesthesia Procedure Notes (Signed)
Procedure Name: Intubation Date/Time: 08/22/2017 7:41 AM Performed by: Candis Shine, CRNA Pre-anesthesia Checklist: Patient identified, Emergency Drugs available, Suction available and Patient being monitored Patient Re-evaluated:Patient Re-evaluated prior to induction Oxygen Delivery Method: Circle System Utilized Preoxygenation: Pre-oxygenation with 100% oxygen Induction Type: IV induction Laryngoscope Size: Mac and 4 Grade View: Grade I Tube type: Oral Tube size: 7.0 mm Number of attempts: 1 Airway Equipment and Method: Stylet and Oral airway Placement Confirmation: ETT inserted through vocal cords under direct vision,  positive ETCO2 and breath sounds checked- equal and bilateral Secured at: 23 cm Tube secured with: Tape Dental Injury: Teeth and Oropharynx as per pre-operative assessment

## 2017-08-22 NOTE — Interval H&P Note (Signed)
History and Physical Interval Note:  08/22/2017 7:36 AM  Isaac Abbott  has presented today for surgery, with the diagnosis of RESPRITORY FAILURE  The various methods of treatment have been discussed with the patient and family. After consideration of risks, benefits and other options for treatment, the patient has consented to  Procedure(s): TRACHEOSTOMY (N/A) as a surgical intervention .  The patient's history has been reviewed, patient examined, no change in status, stable for surgery.  I have reviewed the patient's chart and labs.  Questions were answered to the patient's satisfaction.     [name]

## 2017-08-22 NOTE — H&P (Signed)
PREOPERATIVE H&P  Chief Complaint: Respiratory failure  [name] is a 72 y.o. male who presents for evaluation of respiratory failure. Patient has history of several strokes with history of aspiration and pneumonia with poor airway protection. Previous history of COPD and chronic Afib. He has been intubated and extubated several times and trach was recommended. He's taken to the OR for tracheostomy.  History reviewed. No pertinent past medical history. Past Surgical History:  Procedure Laterality Date  . IR GASTROSTOMY TUBE MOD SED  08/17/2017   Social History   Socioeconomic History  . Marital status: Not on file    Spouse name: Not on file  . Number of children: Not on file  . Years of education: Not on file  . Highest education level: Not on file  Occupational History  . Not on file  Social Needs  . Financial resource strain: Not on file  . Food insecurity:    Worry: Not on file    Inability: Not on file  . Transportation needs:    Medical: Not on file    Non-medical: Not on file  Tobacco Use  . Smoking status: Not on file  Substance and Sexual Activity  . Alcohol use: Not on file  . Drug use: Not on file  . Sexual activity: Not on file  Lifestyle  . Physical activity:    Days per week: Not on file    Minutes per session: Not on file  . Stress: Not on file  Relationships  . Social connections:    Talks on phone: Not on file    Gets together: Not on file    Attends religious service: Not on file    Active member of club or organization: Not on file    Attends meetings of clubs or organizations: Not on file    Relationship status: Not on file  Other Topics Concern  . Not on file  Social History Narrative  . Not on file   History reviewed. No pertinent family history. Not on File Prior to Admission medications   Not on File     Positive ROS: neg  All other systems have been reviewed and were otherwise negative with the exception of those mentioned  in the HPI and as above.  Physical Exam: Vitals:   08/17/17 1100 08/17/17 1110  BP: (!) 143/88 (!) 160/76  Pulse: 96 87  Resp: 18 (!) 22  SpO2: 95% 96%    General: Alert, no acute distress Oral: normal oropharynx Nasal: Clear nasal passages Neck: No palpable adenopathy or thyroid nodules. Trach midline Cardiovascular: Irregular rate and rhythm, no murmur.  Respiratory: Clear to auscultation Neurologic: Alert and oriented x 3   Assessment/Plan: RESPRITORY FAILURE Plan for Procedure(s): TRACHEOSTOMY   [name], MD 08/22/2017 7:31 AM

## 2017-08-22 NOTE — Anesthesia Postprocedure Evaluation (Addendum)
Anesthesia Post Note  Patient: Isaac Abbott  Procedure(s) Performed: TRACHEOSTOMY (N/A )     Patient location during evaluation: Other Anesthesia Type: General Level of consciousness: sedated Pain management: pain level controlled Vital Signs Assessment: post-procedure vital signs reviewed and stable Respiratory status: spontaneous breathing and respiratory function stable Cardiovascular status: stable Postop Assessment: no apparent nausea or vomiting Anesthetic complications: no    Last Vitals:  Vitals:   08/17/17 1100 08/17/17 1110  BP: (!) 143/88 (!) 160/76  Pulse: 96 87  Resp: 18 (!) 22  SpO2: 95% 96%             Jamelle Goldston DANIEL

## 2017-08-22 NOTE — Transfer of Care (Signed)
Immediate Anesthesia Transfer of Care Note  Patient: Isaac Abbott  Procedure(s) Performed: TRACHEOSTOMY (N/A )  Patient Location: [facility] room 7  Anesthesia Type:General  Level of Consciousness: Patient remains intubated per anesthesia plan  Airway & Oxygen Therapy: Patient placed on Ventilator (see vital sign flow sheet for setting)  Post-op Assessment: Report given to RN and Post -op Vital signs reviewed and stable  Post vital signs: Reviewed and stable  Last Vitals:  Vitals Value Taken Time  BP    Temp    Pulse    Resp    SpO2      Last Pain: There were no vitals filed for this visit.       Complications: No apparent anesthesia complications

## 2017-08-22 NOTE — Consult Note (Signed)
Pulmonary Critical Care Medicine [facility] PULMONARY SERVICE  Date of Service: 08/22/2017  PULMONARY CONSULT   Isaac Abbott  ZOX:[id number]  DOB: Apr 03, 1946   DOA: 08/10/2017  Referring Physician: Carron Curie, MD  HPI: Isaac Abbott is a 72 y.o. male seen for follow up of Acute on Chronic Respiratory Failure.  Patient presented with an acute CVA involving the left side.  Patient was transferred to the facility however was not able to be given TPA because the patient was outside the time window.  Neurology saw the patient and recommended anticoagulation therapy.  Patient on presentation was in respiratory distress had to be intubated.  Patient was found at that time to have a aspiration pneumonia.  Patient also underwent a PEG tube placement because of dysphagia the patient presented to this facility was noted to have very much excessive secretions.  He was off of the ventilator.  He was treated conservatively with nebulized treatments and pulmonary toilet.  Also was given antibiotics for presumed infection.  Clinically patient deteriorated and just underwent for tracheostomy for basically airway protection  Review of Systems:  ROS performed and is unremarkable other than noted above.  Past Medical History:  Diagnosis Date  . Acute on chronic respiratory failure with hypoxia (HCC)   . Aspiration pneumonia (HCC)   . GERD (gastroesophageal reflux disease)   . Hyperlipidemia   . Stroke (cerebrum) [facility])     Past Surgical History:  Procedure Laterality Date  . IR GASTROSTOMY TUBE MOD SED  08/17/2017    Social History:    has no tobacco, alcohol, and drug history on file.  Family History: Non-Contributory to the present illness  Not on File  Medications: Reviewed on Rounds  Physical Exam:  Vitals: Temperature 98.1 pulse 76 respiratory rate 22 blood pressure 154/78 saturations 100%  Ventilator Settings mode of ventilation assist control FiO2 35% tidal volume 500 PEEP  5  . General: Comfortable at this time . Eyes: Grossly normal lids, irises & conjunctiva . ENT: grossly tongue is normal . Neck: no obvious mass . Cardiovascular: S1-S2 normal no gallop or rub . Respiratory: Scattered rhonchi coarse breath sounds . Abdomen: Soft nontender . Skin: no rash seen on limited exam . Musculoskeletal: not rigid . Psychiatric:unable to assess . Neurologic: no seizure no involuntary movements         Labs on Admission:  Basic Metabolic Panel: Recent Labs  Lab 08/16/17 0557 08/16/17 1420 08/17/17 0651 08/22/17 0541  NA 137  --  137 136  K 3.8 4.0 4.1 4.8  CL 100*  --  100* 99*  CO2 28  --  29 27  GLUCOSE 143*  --  128* 110*  BUN 21*  --  19 18  CREATININE 0.81  --  0.81 0.89  CALCIUM 8.7*  --  8.8* 9.3    Liver Function Tests: Recent Labs  Lab 08/16/17 0557 08/17/17 0651 08/20/17 0634 08/22/17 0541  AST 58* 57* 59* 49*  ALT 110* 132* 155* 123*  ALKPHOS 85 95 146* 127*  BILITOT 1.1 1.0 0.7 0.7  PROT 5.9* 6.2* 6.3* 6.4*  ALBUMIN 2.6* 2.8* 2.8* 3.0*   No results for input(s): LIPASE, AMYLASE in the last 168 hours. No results for input(s): AMMONIA in the last 168 hours.  CBC: Recent Labs  Lab 08/16/17 0557  WBC 8.0  HGB 14.8  HCT 43.1  MCV 90.2  PLT 272    Cardiac Enzymes: No results for input(s): CKTOTAL, CKMB, CKMBINDEX, TROPONINI in the last  168 hours.  BNP (last 3 results) No results for input(s): BNP in the last 8760 hours.  ProBNP (last 3 results) No results for input(s): PROBNP in the last 8760 hours.   Radiological Exams on Admission: No results found.  Assessment/Plan Active Problems:   Aspiration pneumonia (HCC)   Acute on chronic respiratory failure with hypoxia (HCC)   GERD (gastroesophageal reflux disease)   Stroke (cerebrum) (HCC)   Tracheostomy status (HCC)   1. Acute on chronic respiratory failure with hypoxia patient is now status post tracheostomy will continue with full vent support at this time  we will continue pulmonary toilet secretion management hopefully will be able to advance to T collar trials. 2. Stroke continue with present management restorative therapy 3. GERD at baseline we will continue monitoring 4. Pneumonia due to aspiration will need aspiration precautions 5. Status post tracheostomy continue with supportive care  I have personally seen and evaluated the patient, evaluated laboratory and imaging results, formulated the assessment and plan and placed orders. The Patient requires high complexity decision making for assessment and support.  Case was discussed on Rounds with the Respiratory Therapy Staff Time Spent  Yevonne Pax, MD [facility] Pulmonary Critical Care Medicine Sleep Medicine

## 2017-08-23 ENCOUNTER — Encounter (HOSPITAL_COMMUNITY): Payer: Self-pay | Admitting: Otolaryngology

## 2017-08-23 DIAGNOSIS — K219 Gastro-esophageal reflux disease without esophagitis: Secondary | ICD-10-CM | POA: Diagnosis not present

## 2017-08-23 DIAGNOSIS — J9621 Acute and chronic respiratory failure with hypoxia: Secondary | ICD-10-CM | POA: Diagnosis not present

## 2017-08-23 DIAGNOSIS — I6359 Cerebral infarction due to unspecified occlusion or stenosis of other cerebral artery: Secondary | ICD-10-CM | POA: Diagnosis not present

## 2017-08-23 DIAGNOSIS — J690 Pneumonitis due to inhalation of food and vomit: Secondary | ICD-10-CM | POA: Diagnosis not present

## 2017-08-23 NOTE — Progress Notes (Signed)
Pulmonary Critical Care Medicine [facility] [place]   PULMONARY SERVICE  PROGRESS NOTE  Date of Service: 08/23/2017  Singleton Hickox  ZOX:[id number]  DOB: 1946/03/11   DOA: 08/10/2017  Referring Physician: Carron Curie, MD  HPI: Marcellis Frampton is a 72 y.o. male seen for follow up of Acute on Chronic Respiratory Failure.  Patient did about 4 hours weaning on pressure support looks comfortable we should be able to hopefully advance further.  Right now is in the resting lordosis control at this time.  Medications: Reviewed on Rounds  Physical Exam:  Vitals: Temperature 97.6 pulse 107 respiratory rate 20 blood pressure 145/62 saturations 93%  Ventilator Settings mode of ventilation assist control FiO2 35% tidal volume 554 PEEP of 5  . General: Comfortable at this time . Eyes: Grossly normal lids, irises & conjunctiva . ENT: grossly tongue is normal . Neck: no obvious mass . Cardiovascular: S1-S2 normal no gallop or rub . Respiratory: No rhonchi expansion is equal . Abdomen: Soft nontender . Skin: no rash seen on limited exam . Musculoskeletal: not rigid . Psychiatric:unable to assess . Neurologic: no seizure no involuntary movements         Labs on Admission:  Basic Metabolic Panel: Recent Labs  Lab 08/17/17 0651 08/22/17 0541  NA 137 136  K 4.1 4.8  CL 100* 99*  CO2 29 27  GLUCOSE 128* 110*  BUN 19 18  CREATININE 0.81 0.89  CALCIUM 8.8* 9.3    Liver Function Tests: Recent Labs  Lab 08/17/17 0651 08/20/17 0634 08/22/17 0541  AST 57* 59* 49*  ALT 132* 155* 123*  ALKPHOS 95 146* 127*  BILITOT 1.0 0.7 0.7  PROT 6.2* 6.3* 6.4*  ALBUMIN 2.8* 2.8* 3.0*   No results for input(s): LIPASE, AMYLASE in the last 168 hours. No results for input(s): AMMONIA in the last 168 hours.  CBC: No results for input(s): WBC, NEUTROABS, HGB, HCT, MCV, PLT in the last 168 hours.  Cardiac Enzymes: No results for input(s): CKTOTAL, CKMB, CKMBINDEX, TROPONINI in the last  168 hours.  BNP (last 3 results) No results for input(s): BNP in the last 8760 hours.  ProBNP (last 3 results) No results for input(s): PROBNP in the last 8760 hours.  Radiological Exams on Admission: No results found.  Assessment/Plan Active Problems:   Aspiration pneumonia (HCC)   Acute on chronic respiratory failure with hypoxia (HCC)   GERD (gastroesophageal reflux disease)   Stroke (cerebrum) (HCC)   Tracheostomy status (HCC)   1. Acute on chronic respiratory failure with hypoxia we will continue with weaning on pressure support mode as tolerated continue pulmonary toilet secretion management supportive care 2. Pneumonia due to aspiration right now is comfortable we will continue to monitor x-rays as necessary. 3. GERD at baseline we will continue supportive care 4. Stroke restorative therapy physical therapy as needed. 5. Tracheostomy status had tracheostomy done for airway issues   I have personally seen and evaluated the patient, evaluated laboratory and imaging results, formulated the assessment and plan and placed orders. The Patient requires high complexity decision making for assessment and support.  Case was discussed on Rounds with the Respiratory Therapy Staff  Yevonne Pax, MD [facility] Pulmonary Critical Care Medicine Sleep Medicine

## 2017-08-24 DIAGNOSIS — J690 Pneumonitis due to inhalation of food and vomit: Secondary | ICD-10-CM | POA: Diagnosis not present

## 2017-08-24 DIAGNOSIS — K219 Gastro-esophageal reflux disease without esophagitis: Secondary | ICD-10-CM | POA: Diagnosis not present

## 2017-08-24 DIAGNOSIS — I6359 Cerebral infarction due to unspecified occlusion or stenosis of other cerebral artery: Secondary | ICD-10-CM | POA: Diagnosis not present

## 2017-08-24 DIAGNOSIS — J9621 Acute and chronic respiratory failure with hypoxia: Secondary | ICD-10-CM | POA: Diagnosis not present

## 2017-08-24 NOTE — Progress Notes (Signed)
Pulmonary Critical Care Medicine [facility] [place]   PULMONARY SERVICE  PROGRESS NOTE  Date of Service: 08/24/2017  Isaac Abbott  ZOX:[id number]  DOB: [birth year]   DOA: 08/10/2017  Referring Physician: Carron Curie, MD  HPI: Isaac Abbott is a 72 y.o. male seen for follow up of Acute on Chronic Respiratory Failure.  Patient is weaning right now on pressure support mode the goal is for about 8 hours.  Looks comfortable right now no distress is noted  Medications: Reviewed on Rounds  Physical Exam:  Vitals: Temperature 98.2 pulse 103 respiratory rate 18 blood pressure 107/63 saturations 98%  Ventilator Settings mode of ventilation pressure support FiO2 35% tidal volume 580 pressure support 12 PEEP 5  . General: Comfortable at this time . Eyes: Grossly normal lids, irises & conjunctiva . ENT: grossly tongue is normal . Neck: no obvious mass . Cardiovascular: S1-S2 normal no gallop or rub . Respiratory: No rhonchi expansion is equal . Abdomen: Soft nontender . Skin: no rash seen on limited exam . Musculoskeletal: not rigid . Psychiatric:unable to assess . Neurologic: no seizure no involuntary movements         Labs on Admission:  Basic Metabolic Panel: Recent Labs  Lab 08/22/17 0541  NA 136  K 4.8  CL 99*  CO2 27  GLUCOSE 110*  BUN 18  CREATININE 0.89  CALCIUM 9.3    Liver Function Tests: Recent Labs  Lab 08/20/17 0634 08/22/17 0541  AST 59* 49*  ALT 155* 123*  ALKPHOS 146* 127*  BILITOT 0.7 0.7  PROT 6.3* 6.4*  ALBUMIN 2.8* 3.0*   No results for input(s): LIPASE, AMYLASE in the last 168 hours. No results for input(s): AMMONIA in the last 168 hours.  CBC: No results for input(s): WBC, NEUTROABS, HGB, HCT, MCV, PLT in the last 168 hours.  Cardiac Enzymes: No results for input(s): CKTOTAL, CKMB, CKMBINDEX, TROPONINI in the last 168 hours.  BNP (last 3 results) No results for input(s): BNP in the last 8760 hours.  ProBNP (last 3  results) No results for input(s): PROBNP in the last 8760 hours.  Radiological Exams on Admission: No results found.  Assessment/Plan Active Problems:   Aspiration pneumonia (HCC)   Acute on chronic respiratory failure with hypoxia (HCC)   GERD (gastroesophageal reflux disease)   Stroke (cerebrum) (HCC)   Tracheostomy status (HCC)   1. Acute on chronic respiratory failure with hypoxia we will continue with pressure support mode now weaning right now we will continue to advance to wean as tolerated continue secretion management. 2. Pneumonia due to aspiration clinically treated we will follow along follow x-rays as necessary 3. GERD stable 4. Stroke at baseline continue with restorative therapy 5. Status post tracheostomy we will continue to monitor   I have personally seen and evaluated the patient, evaluated laboratory and imaging results, formulated the assessment and plan and placed orders. The Patient requires high complexity decision making for assessment and support.  Case was discussed on Rounds with the Respiratory Therapy Staff  Yevonne Pax, MD [facility] Pulmonary Critical Care Medicine Sleep Medicine

## 2017-08-25 DIAGNOSIS — I6359 Cerebral infarction due to unspecified occlusion or stenosis of other cerebral artery: Secondary | ICD-10-CM | POA: Diagnosis not present

## 2017-08-25 DIAGNOSIS — J690 Pneumonitis due to inhalation of food and vomit: Secondary | ICD-10-CM | POA: Diagnosis not present

## 2017-08-25 DIAGNOSIS — K219 Gastro-esophageal reflux disease without esophagitis: Secondary | ICD-10-CM | POA: Diagnosis not present

## 2017-08-25 DIAGNOSIS — J9621 Acute and chronic respiratory failure with hypoxia: Secondary | ICD-10-CM | POA: Diagnosis not present

## 2017-08-25 LAB — COMPREHENSIVE METABOLIC PANEL
ALT: 259 U/L — ABNORMAL HIGH (ref 17–63)
ANION GAP: 12 (ref 5–15)
AST: 42 U/L — AB (ref 15–41)
Albumin: 2.9 g/dL — ABNORMAL LOW (ref 3.5–5.0)
Alkaline Phosphatase: 289 U/L — ABNORMAL HIGH (ref 38–126)
BILIRUBIN TOTAL: 1.2 mg/dL (ref 0.3–1.2)
BUN: 22 mg/dL — AB (ref 6–20)
CO2: 29 mmol/L (ref 22–32)
Calcium: 9 mg/dL (ref 8.9–10.3)
Chloride: 93 mmol/L — ABNORMAL LOW (ref 101–111)
Creatinine, Ser: 0.79 mg/dL (ref 0.61–1.24)
GFR calc Af Amer: >60 mL/min (ref >60)
Glucose, Bld: 117 mg/dL — ABNORMAL HIGH (ref 65–99)
POTASSIUM: 4.2 mmol/L (ref 3.5–5.1)
Sodium: 134 mmol/L — ABNORMAL LOW (ref 135–145)
TOTAL PROTEIN: 6.2 g/dL — AB (ref 6.5–8.1)

## 2017-08-25 NOTE — Progress Notes (Signed)
Pulmonary Critical Care Medicine [facility] [place]   PULMONARY SERVICE  PROGRESS NOTE  Date of Service: 08/25/2017  [name]  UJW:[id number]  DOB: Mar 20, 1946   DOA: 08/10/2017  Referring Physician: Carron Curie, MD  HPI: Isaac Abbott is a 72 y.o. male seen for follow up of Acute on Chronic Respiratory Failure.  Weaning on pressure support today looks good so far goal is for about 12 hours  Medications: Reviewed on Rounds  Physical Exam:  Vitals: Temperature 98.7 pulse 97 respiratory rate 23 blood pressure 127/65  Ventilator Settings mode of ventilation pressure support FiO2 35% tidal volume 684 per support 12 PEEP 5  . General: Comfortable at this time . Eyes: Grossly normal lids, irises & conjunctiva . ENT: grossly tongue is normal . Neck: no obvious mass . Cardiovascular: S1-S2 normal no gallop or rub . Respiratory: No rhonchi expansion is equal . Abdomen: Soft nontender . Skin: no rash seen on limited exam . Musculoskeletal: not rigid . Psychiatric:unable to assess . Neurologic: no seizure no involuntary movements         Labs on Admission:  Basic Metabolic Panel: Recent Labs  Lab 08/22/17 0541 08/25/17 0639  NA 136 134*  K 4.8 4.2  CL 99* 93*  CO2 27 29  GLUCOSE 110* 117*  BUN 18 22*  CREATININE 0.89 0.79  CALCIUM 9.3 9.0    Liver Function Tests: Recent Labs  Lab 08/20/17 0634 08/22/17 0541 08/25/17 0639  AST 59* 49* 42*  ALT 155* 123* 259*  ALKPHOS 146* 127* 289*  BILITOT 0.7 0.7 1.2  PROT 6.3* 6.4* 6.2*  ALBUMIN 2.8* 3.0* 2.9*   No results for input(s): LIPASE, AMYLASE in the last 168 hours. No results for input(s): AMMONIA in the last 168 hours.  CBC: No results for input(s): WBC, NEUTROABS, HGB, HCT, MCV, PLT in the last 168 hours.  Cardiac Enzymes: No results for input(s): CKTOTAL, CKMB, CKMBINDEX, TROPONINI in the last 168 hours.  BNP (last 3 results) No results for input(s): BNP in the last 8760 hours.  ProBNP (last  3 results) No results for input(s): PROBNP in the last 8760 hours.  Radiological Exams on Admission: No results found.  Assessment/Plan Active Problems:   Aspiration pneumonia (HCC)   Acute on chronic respiratory failure with hypoxia (HCC)   GERD (gastroesophageal reflux disease)   Stroke (cerebrum) (HCC)   Tracheostomy status (HCC)   1. Acute on chronic respiratory failure with hypoxia currently is doing well weaning continue to advance the weaning on pressure support mode as mentioned above the goal is for 12 hours we will continue with supportive care 2. GERD at baseline we will continue to monitor 3. Pneumonia due to aspiration treated with antibiotics we will follow-up x-rays as needed 4. Stroke at baseline continue restorative therapy 5. Tracheostomy working towards capping and decannulation hopefully   I have personally seen and evaluated the patient, evaluated laboratory and imaging results, formulated the assessment and plan and placed orders. The Patient requires high complexity decision making for assessment and support.  Case was discussed on Rounds with the Respiratory Therapy Staff  Yevonne Pax, MD [facility] Pulmonary Critical Care Medicine Sleep Medicine

## 2017-08-26 DIAGNOSIS — J9621 Acute and chronic respiratory failure with hypoxia: Secondary | ICD-10-CM | POA: Diagnosis not present

## 2017-08-26 DIAGNOSIS — K219 Gastro-esophageal reflux disease without esophagitis: Secondary | ICD-10-CM | POA: Diagnosis not present

## 2017-08-26 DIAGNOSIS — J690 Pneumonitis due to inhalation of food and vomit: Secondary | ICD-10-CM | POA: Diagnosis not present

## 2017-08-26 DIAGNOSIS — I6359 Cerebral infarction due to unspecified occlusion or stenosis of other cerebral artery: Secondary | ICD-10-CM | POA: Diagnosis not present

## 2017-08-26 NOTE — Progress Notes (Signed)
Pulmonary Critical Care Medicine [facility] [place]   PULMONARY SERVICE  PROGRESS NOTE  Date of Service: 08/26/2017  Isaac Abbott  [id number]  DOB: [birth year]   DOA: 08/10/2017  Referring Physician: Carron Curie, MD  HPI: Isaac Abbott is a 72 y.o. male seen for follow up of Acute on Chronic Respiratory Failure.  Right now patient is on pressure support mode has been on 35% oxygen good saturations.  The goal is for 18 hours but I think will be expanded to as tolerated since he has excellent tidal volumes noted.  Medications: Reviewed on Rounds  Physical Exam:  Vitals: Temperature 96.6 pulse 87 respiratory rate 16 blood pressure 130/85 saturations 99%  Ventilator Settings mode of ventilation pressure support FiO2 35% tidal volume 701 pressure support 12/5  . General: Comfortable at this time . Eyes: Grossly normal lids, irises & conjunctiva . ENT: grossly tongue is normal . Neck: no obvious mass . Cardiovascular: S1-S2 normal no gallop or rub . Respiratory: No rhonchi coarse breath sounds . Abdomen: Soft nontender . Skin: no rash seen on limited exam . Musculoskeletal: not rigid . Psychiatric:unable to assess . Neurologic: no seizure no involuntary movements         Labs on Admission:  Basic Metabolic Panel: Recent Labs  Lab 08/22/17 0541 08/25/17 0639  NA 136 134*  K 4.8 4.2  CL 99* 93*  CO2 27 29  GLUCOSE 110* 117*  BUN 18 22*  CREATININE 0.89 0.79  CALCIUM 9.3 9.0    Liver Function Tests: Recent Labs  Lab 08/20/17 0634 08/22/17 0541 08/25/17 0639  AST 59* 49* 42*  ALT 155* 123* 259*  ALKPHOS 146* 127* 289*  BILITOT 0.7 0.7 1.2  PROT 6.3* 6.4* 6.2*  ALBUMIN 2.8* 3.0* 2.9*   No results for input(s): LIPASE, AMYLASE in the last 168 hours. No results for input(s): AMMONIA in the last 168 hours.  CBC: No results for input(s): WBC, NEUTROABS, HGB, HCT, MCV, PLT in the last 168 hours.  Cardiac Enzymes: No results for input(s): CKTOTAL,  CKMB, CKMBINDEX, TROPONINI in the last 168 hours.  BNP (last 3 results) No results for input(s): BNP in the last 8760 hours.  ProBNP (last 3 results) No results for input(s): PROBNP in the last 8760 hours.  Radiological Exams on Admission: No results found.  Assessment/Plan Active Problems:   Aspiration pneumonia (HCC)   Acute on chronic respiratory failure with hypoxia (HCC)   GERD (gastroesophageal reflux disease)   Stroke (cerebrum) (HCC)   Tracheostomy status (HCC)   1. Acute on chronic respiratory failure with hypoxia patient is weaning will continue to advance to wean on pressure support and we are changing the order to as tolerated at this time.  We will go ahead and wean for 24 hours and then hopefully begin T collar trial soon. 2. GERD stable at this time continue to monitor 3. Pneumonia due to aspiration clinically improving we will continue to follow 4. Stroke patient's at baseline 5. Tracheostomy working towards air/T collar   I have personally seen and evaluated the patient, evaluated laboratory and imaging results, formulated the assessment and plan and placed orders. The Patient requires high complexity decision making for assessment and support.  Case was discussed on Rounds with the Respiratory Therapy Staff  Yevonne Pax, MD [facility] Pulmonary Critical Care Medicine Sleep Medicine

## 2017-08-27 DIAGNOSIS — I6359 Cerebral infarction due to unspecified occlusion or stenosis of other cerebral artery: Secondary | ICD-10-CM | POA: Diagnosis not present

## 2017-08-27 DIAGNOSIS — K219 Gastro-esophageal reflux disease without esophagitis: Secondary | ICD-10-CM | POA: Diagnosis not present

## 2017-08-27 DIAGNOSIS — J9621 Acute and chronic respiratory failure with hypoxia: Secondary | ICD-10-CM | POA: Diagnosis not present

## 2017-08-27 DIAGNOSIS — J690 Pneumonitis due to inhalation of food and vomit: Secondary | ICD-10-CM | POA: Diagnosis not present

## 2017-08-27 NOTE — Progress Notes (Signed)
Pulmonary Critical Care Medicine [facility] [place]   PULMONARY SERVICE  PROGRESS NOTE  Date of Service: 08/27/2017  Isaac Abbott  OZH:[id number]  DOB: 11-Jul-1945   DOA: 08/10/2017  Referring Physician: Carron Curie, MD  HPI: Isaac Abbott is a 72 y.o. male seen for follow up of Acute on Chronic Respiratory Failure.  He is doing very well sitting up in a chair he is awake appears to be appropriate.  Right now is on T collar has been tolerating it well  Medications: Reviewed on Rounds  Physical Exam:  Vitals: Temperature 97.7 pulse 85 respiratory rate 18 blood pressure 140/67 saturations 94%  Ventilator Settings off of the ventilator on T collar  . General: Comfortable at this time . Eyes: Grossly normal lids, irises & conjunctiva . ENT: grossly tongue is normal . Neck: no obvious mass . Cardiovascular: S1 S2 normal no gallop . Respiratory: Good air entry bilaterally no rhonchi . Abdomen: soft . Skin: no rash seen on limited exam . Musculoskeletal: not rigid . Psychiatric:unable to assess . Neurologic: no seizure no involuntary movements         Labs on Admission:  Basic Metabolic Panel: Recent Labs  Lab 08/22/17 0541 08/25/17 0639  NA 136 134*  K 4.8 4.2  CL 99* 93*  CO2 27 29  GLUCOSE 110* 117*  BUN 18 22*  CREATININE 0.89 0.79  CALCIUM 9.3 9.0    Liver Function Tests: Recent Labs  Lab 08/22/17 0541 08/25/17 0639  AST 49* 42*  ALT 123* 259*  ALKPHOS 127* 289*  BILITOT 0.7 1.2  PROT 6.4* 6.2*  ALBUMIN 3.0* 2.9*   No results for input(s): LIPASE, AMYLASE in the last 168 hours. No results for input(s): AMMONIA in the last 168 hours.  CBC: No results for input(s): WBC, NEUTROABS, HGB, HCT, MCV, PLT in the last 168 hours.  Cardiac Enzymes: No results for input(s): CKTOTAL, CKMB, CKMBINDEX, TROPONINI in the last 168 hours.  BNP (last 3 results) No results for input(s): BNP in the last 8760 hours.  ProBNP (last 3 results) No results for  input(s): PROBNP in the last 8760 hours.  Radiological Exams on Admission: No results found.  Assessment/Plan Active Problems:   Aspiration pneumonia (HCC)   Acute on chronic respiratory failure with hypoxia (HCC)   GERD (gastroesophageal reflux disease)   Stroke (cerebrum) (HCC)   Tracheostomy status (HCC)   1. Acute on chronic respiratory failure with hypoxia we will continue weaning on T collar trials patient is tolerating it fairly well at this time. 2. Aspiration pneumonia treated clinically stable monitor x-rays as necessary 3. GERD stable we will continue to follow 4. Stroke at baseline continue with physical therapy 5. Tracheostomy status stable at this time   I have personally seen and evaluated the patient, evaluated laboratory and imaging results, formulated the assessment and plan and placed orders. The Patient requires high complexity decision making for assessment and support.  Case was discussed on Rounds with the Respiratory Therapy Staff  Yevonne Pax, MD [facility] Pulmonary Critical Care Medicine Sleep Medicine

## 2017-08-28 LAB — PROTIME-INR
INR: 1.04
PROTHROMBIN TIME: 13.5 s (ref 11.4–15.2)

## 2017-08-29 DIAGNOSIS — I6359 Cerebral infarction due to unspecified occlusion or stenosis of other cerebral artery: Secondary | ICD-10-CM | POA: Diagnosis not present

## 2017-08-29 DIAGNOSIS — J9621 Acute and chronic respiratory failure with hypoxia: Secondary | ICD-10-CM | POA: Diagnosis not present

## 2017-08-29 DIAGNOSIS — K219 Gastro-esophageal reflux disease without esophagitis: Secondary | ICD-10-CM | POA: Diagnosis not present

## 2017-08-29 DIAGNOSIS — J690 Pneumonitis due to inhalation of food and vomit: Secondary | ICD-10-CM | POA: Diagnosis not present

## 2017-08-29 LAB — COMPREHENSIVE METABOLIC PANEL
ALT: 191 U/L — ABNORMAL HIGH (ref 17–63)
ANION GAP: 11 (ref 5–15)
AST: 46 U/L — ABNORMAL HIGH (ref 15–41)
Albumin: 2.3 g/dL — ABNORMAL LOW (ref 3.5–5.0)
Alkaline Phosphatase: 370 U/L — ABNORMAL HIGH (ref 38–126)
BILIRUBIN TOTAL: 1.1 mg/dL (ref 0.3–1.2)
BUN: 18 mg/dL (ref 6–20)
CO2: 30 mmol/L (ref 22–32)
Calcium: 8.4 mg/dL — ABNORMAL LOW (ref 8.9–10.3)
Chloride: 91 mmol/L — ABNORMAL LOW (ref 101–111)
Creatinine, Ser: 0.79 mg/dL (ref 0.61–1.24)
Glucose, Bld: 158 mg/dL — ABNORMAL HIGH (ref 65–99)
POTASSIUM: 3.7 mmol/L (ref 3.5–5.1)
Sodium: 132 mmol/L — ABNORMAL LOW (ref 135–145)
TOTAL PROTEIN: 5.8 g/dL — AB (ref 6.5–8.1)

## 2017-08-29 LAB — CBC
HEMATOCRIT: 40.5 % (ref 39.0–52.0)
HEMOGLOBIN: 13.6 g/dL (ref 13.0–17.0)
MCH: 30.4 pg (ref 26.0–34.0)
MCHC: 33.6 g/dL (ref 30.0–36.0)
MCV: 90.4 fL (ref 78.0–100.0)
Platelets: 263 10*3/uL (ref 150–400)
RBC: 4.48 MIL/uL (ref 4.22–5.81)
RDW: 12.9 % (ref 11.5–15.5)
WBC: 5.7 10*3/uL (ref 4.0–10.5)

## 2017-08-29 NOTE — Progress Notes (Signed)
Pulmonary Critical Care Medicine [facility] [place]   PULMONARY SERVICE  PROGRESS NOTE  Date of Service: 08/29/2017  Isaac Abbott  JYN:[id number]  DOB: Nov 26, 1945   DOA: 08/10/2017  Referring Physician: Carron Curie, MD  HPI: Isaac Abbott is a 72 y.o. male seen for follow up of Acute on Chronic Respiratory Failure.  Patient remains on T collar has been doing fairly well has been off the ventilator for more than 48 hours at this stage  Medications: Reviewed on Rounds  Physical Exam:  Vitals: Temperature 97.5 pulse 78 respiratory rate 18 blood pressure 139/64 saturations 93%  Ventilator Settings off the ventilator on T collar FiO2 28%  . General: Comfortable at this time . Eyes: Grossly normal lids, irises & conjunctiva . ENT: grossly tongue is normal . Neck: no obvious mass . Cardiovascular: S1 S2 normal no gallop . Respiratory: No rhonchi expansion is equal . Abdomen: soft . Skin: no rash seen on limited exam . Musculoskeletal: not rigid . Psychiatric:unable to assess . Neurologic: no seizure no involuntary movements         Labs on Admission:  Basic Metabolic Panel: Recent Labs  Lab 08/25/17 0639 08/29/17 0523  NA 134* 132*  K 4.2 3.7  CL 93* 91*  CO2 29 30  GLUCOSE 117* 158*  BUN 22* 18  CREATININE 0.79 0.79  CALCIUM 9.0 8.4*    Liver Function Tests: Recent Labs  Lab 08/25/17 0639 08/29/17 0523  AST 42* 46*  ALT 259* 191*  ALKPHOS 289* 370*  BILITOT 1.2 1.1  PROT 6.2* 5.8*  ALBUMIN 2.9* 2.3*   No results for input(s): LIPASE, AMYLASE in the last 168 hours. No results for input(s): AMMONIA in the last 168 hours.  CBC: Recent Labs  Lab 08/29/17 0523  WBC 5.7  HGB 13.6  HCT 40.5  MCV 90.4  PLT 263    Cardiac Enzymes: No results for input(s): CKTOTAL, CKMB, CKMBINDEX, TROPONINI in the last 168 hours.  BNP (last 3 results) No results for input(s): BNP in the last 8760 hours.  ProBNP (last 3 results) No results for input(s):  PROBNP in the last 8760 hours.  Radiological Exams on Admission: No results found.  Assessment/Plan Active Problems:   Aspiration pneumonia (HCC)   Acute on chronic respiratory failure with hypoxia (HCC)   GERD (gastroesophageal reflux disease)   Stroke (cerebrum) (HCC)   Tracheostomy status (HCC)   1. Acute on chronic respiratory failure with hypoxia patient right now is on T collar wean as indicated try to wean FiO2 down as tolerated also.  We will continue with pulmonary toilet supportive care.  At this time patient is not to be decannulated because of inability to handle secretions. 2. Pneumonia due to aspiration treated with antibiotics we will continue supportive care tracheostomy was placed in hopes of preventing aspiration 3. GERD at baseline 4. Stroke continue with restorative therapy 5. Tracheostomy status will remain in place as noted above   I have personally seen and evaluated the patient, evaluated laboratory and imaging results, formulated the assessment and plan and placed orders. The Patient requires high complexity decision making for assessment and support.  Case was discussed on Rounds with the Respiratory Therapy Staff  Yevonne Pax, MD [facility] Pulmonary Critical Care Medicine Sleep Medicine

## 2017-08-30 ENCOUNTER — Other Ambulatory Visit (HOSPITAL_COMMUNITY): Payer: Medicaid Other

## 2017-08-30 DIAGNOSIS — I6359 Cerebral infarction due to unspecified occlusion or stenosis of other cerebral artery: Secondary | ICD-10-CM | POA: Diagnosis not present

## 2017-08-30 DIAGNOSIS — J9621 Acute and chronic respiratory failure with hypoxia: Secondary | ICD-10-CM | POA: Diagnosis not present

## 2017-08-30 DIAGNOSIS — K219 Gastro-esophageal reflux disease without esophagitis: Secondary | ICD-10-CM | POA: Diagnosis not present

## 2017-08-30 DIAGNOSIS — J690 Pneumonitis due to inhalation of food and vomit: Secondary | ICD-10-CM | POA: Diagnosis not present

## 2017-08-30 LAB — CULTURE, RESPIRATORY

## 2017-08-30 LAB — CULTURE, RESPIRATORY W GRAM STAIN

## 2017-08-30 NOTE — Progress Notes (Signed)
Pulmonary Critical Care Medicine [facility] [place]   PULMONARY SERVICE  PROGRESS NOTE  Date of Service: 08/30/2017  Isaac Abbott  ZOX:[id number]  DOB: November 04, 1945   DOA: 08/10/2017  Referring Physician: Carron Curie, MD  HPI: Isaac Abbott is a 72 y.o. male seen for follow up of Acute on Chronic Respiratory Failure.  Doing fairly well on T collar at this time patient's been on 35% oxygen.  Has been off the ventilator for more than 72 hours  Medications: Reviewed on Rounds  Physical Exam:  Vitals: Temperature 96.5 pulse 80 respiratory rate 20 blood pressure 143/75 saturations 92%  Ventilator Settings off ventilator on T collar  . General: Comfortable at this time . Eyes: Grossly normal lids, irises & conjunctiva . ENT: grossly tongue is normal . Neck: no obvious mass . Cardiovascular: S1 S2 normal no gallop . Respiratory: No rhonchi expansion is equal . Abdomen: soft . Skin: no rash seen on limited exam . Musculoskeletal: not rigid . Psychiatric:unable to assess . Neurologic: no seizure no involuntary movements         Labs on Admission:  Basic Metabolic Panel: Recent Labs  Lab 08/25/17 0639 08/29/17 0523  NA 134* 132*  K 4.2 3.7  CL 93* 91*  CO2 29 30  GLUCOSE 117* 158*  BUN 22* 18  CREATININE 0.79 0.79  CALCIUM 9.0 8.4*    Liver Function Tests: Recent Labs  Lab 08/25/17 0639 08/29/17 0523  AST 42* 46*  ALT 259* 191*  ALKPHOS 289* 370*  BILITOT 1.2 1.1  PROT 6.2* 5.8*  ALBUMIN 2.9* 2.3*   No results for input(s): LIPASE, AMYLASE in the last 168 hours. No results for input(s): AMMONIA in the last 168 hours.  CBC: Recent Labs  Lab 08/29/17 0523  WBC 5.7  HGB 13.6  HCT 40.5  MCV 90.4  PLT 263    Cardiac Enzymes: No results for input(s): CKTOTAL, CKMB, CKMBINDEX, TROPONINI in the last 168 hours.  BNP (last 3 results) No results for input(s): BNP in the last 8760 hours.  ProBNP (last 3 results) No results for input(s): PROBNP in  the last 8760 hours.  Radiological Exams on Admission: US Abdomen Limited Ruq  Result Date: 08/30/2017 CLINICAL DATA:  Cholecystitis. EXAM: ULTRASOUND ABDOMEN LIMITED RIGHT UPPER QUADRANT COMPARISON:  CT scan of Aug 15, 2017. FINDINGS: Gallbladder: No gallstones or wall thickening visualized. No sonographic Murphy sign noted by sonographer. Sludge is noted within gallbladder lumen. Common bile duct: Diameter: 3.3 mm which is within normal limits. Liver: No focal lesion identified. Within normal limits in parenchymal echogenicity. Portal vein is patent on color Doppler imaging with normal direction of blood flow towards the liver. IMPRESSION: Sludge is noted within gallbladder lumen. No other abnormality seen in the right upper quadrant of the abdomen. Electronically Signed   By: Lupita Raider, M.D.   On: 08/30/2017 07:03    Assessment/Plan Active Problems:   Aspiration pneumonia (HCC)   Acute on chronic respiratory failure with hypoxia (HCC)   GERD (gastroesophageal reflux disease)   Stroke (cerebrum) (HCC)   Tracheostomy status (HCC)   1. Acute on chronic respiratory failure with hypoxia continue with T collar trials continue pulmonary toilet secretion management 2. GERD at baseline we will continue to follow 3. Stroke grossly remains unchanged we will continue restorative therapy 4. Pneumonia due to aspiration follow radiologically continue supportive care 5. Tracheostomy will be continued   I have personally seen and evaluated the patient, evaluated laboratory and imaging results, formulated  the assessment and plan and placed orders. The Patient requires high complexity decision making for assessment and support.  Case was discussed on Rounds with the Respiratory Therapy Staff  Allyne Gee, MD [facility] Pulmonary Critical Care Medicine Sleep Medicine

## 2017-08-31 DIAGNOSIS — I6359 Cerebral infarction due to unspecified occlusion or stenosis of other cerebral artery: Secondary | ICD-10-CM | POA: Diagnosis not present

## 2017-08-31 DIAGNOSIS — J9621 Acute and chronic respiratory failure with hypoxia: Secondary | ICD-10-CM | POA: Diagnosis not present

## 2017-08-31 DIAGNOSIS — J690 Pneumonitis due to inhalation of food and vomit: Secondary | ICD-10-CM | POA: Diagnosis not present

## 2017-08-31 DIAGNOSIS — K219 Gastro-esophageal reflux disease without esophagitis: Secondary | ICD-10-CM | POA: Diagnosis not present

## 2017-08-31 LAB — HEPATITIS B SURFACE ANTIBODY, QUANTITATIVE

## 2017-08-31 LAB — HEPATITIS B CORE ANTIBODY, IGM: HEP B C IGM: NEGATIVE

## 2017-08-31 LAB — HEPATITIS B SURFACE ANTIGEN: HEP B S AG: NEGATIVE

## 2017-08-31 NOTE — Progress Notes (Signed)
Pulmonary Critical Care Medicine [facility] [place]   PULMONARY SERVICE  PROGRESS NOTE  Date of Service: 08/31/2017  Isaac Abbott  ZOX:[id number]  DOB: [birth year]   DOA: 08/10/2017  Referring Physician: Carron Curie, MD  HPI: Isaac Abbott is a 72 y.o. male seen for follow up of Acute on Chronic Respiratory Failure.  Currently is on T collar weaning doing well.  We should be able to hopefully deflate the cuff today  Medications: Reviewed on Rounds  Physical Exam:  Vitals: Temperature 97.2 pulse 86 respiratory rate 18 blood pressure 143/72 saturations 90%  Ventilator Settings off the ventilator on T collar  . General: Comfortable at this time . Eyes: Grossly normal lids, irises & conjunctiva . ENT: grossly tongue is normal . Neck: no obvious mass . Cardiovascular: S1 S2 normal no gallop . Respiratory: No rhonchi expansion equal . Abdomen: soft . Skin: no rash seen on limited exam . Musculoskeletal: not rigid . Psychiatric:unable to assess . Neurologic: no seizure no involuntary movements         Labs on Admission:  Basic Metabolic Panel: Recent Labs  Lab 08/25/17 0639 08/29/17 0523  NA 134* 132*  K 4.2 3.7  CL 93* 91*  CO2 29 30  GLUCOSE 117* 158*  BUN 22* 18  CREATININE 0.79 0.79  CALCIUM 9.0 8.4*    Liver Function Tests: Recent Labs  Lab 08/25/17 0639 08/29/17 0523  AST 42* 46*  ALT 259* 191*  ALKPHOS 289* 370*  BILITOT 1.2 1.1  PROT 6.2* 5.8*  ALBUMIN 2.9* 2.3*   No results for input(s): LIPASE, AMYLASE in the last 168 hours. No results for input(s): AMMONIA in the last 168 hours.  CBC: Recent Labs  Lab 08/29/17 0523  WBC 5.7  HGB 13.6  HCT 40.5  MCV 90.4  PLT 263    Cardiac Enzymes: No results for input(s): CKTOTAL, CKMB, CKMBINDEX, TROPONINI in the last 168 hours.  BNP (last 3 results) No results for input(s): BNP in the last 8760 hours.  ProBNP (last 3 results) No results for input(s): PROBNP in the last 8760  hours.  Radiological Exams on Admission: US Abdomen Limited Ruq  Result Date: 08/30/2017 CLINICAL DATA:  Cholecystitis. EXAM: ULTRASOUND ABDOMEN LIMITED RIGHT UPPER QUADRANT COMPARISON:  CT scan of Aug 15, 2017. FINDINGS: Gallbladder: No gallstones or wall thickening visualized. No sonographic Murphy sign noted by sonographer. Sludge is noted within gallbladder lumen. Common bile duct: Diameter: 3.3 mm which is within normal limits. Liver: No focal lesion identified. Within normal limits in parenchymal echogenicity. Portal vein is patent on color Doppler imaging with normal direction of blood flow towards the liver. IMPRESSION: Sludge is noted within gallbladder lumen. No other abnormality seen in the right upper quadrant of the abdomen. Electronically Signed   By: Lupita Raider, M.D.   On: 08/30/2017 07:03    Assessment/Plan Active Problems:   Aspiration pneumonia (HCC)   Acute on chronic respiratory failure with hypoxia (HCC)   GERD (gastroesophageal reflux disease)   Stroke (cerebrum) (HCC)   Tracheostomy status (HCC)   1. Acute on chronic respiratory failure with hypoxia we will continue with weaning on T collar trials as tolerated continue with pulmonary toilet supportive care patient seems to be doing fine with the current oxygen settings. 2. GERD stable we will continue with present management 3. Stroke at baseline we will continue present therapy 4. Tracheostomy to be remaining in place 5. Pneumonia due to aspiration treated clinically improved   I have personally  seen and evaluated the patient, evaluated laboratory and imaging results, formulated the assessment and plan and placed orders. The Patient requires high complexity decision making for assessment and support.  Case was discussed on Rounds with the Respiratory Therapy Staff  Allyne Gee, MD [facility] Pulmonary Critical Care Medicine Sleep Medicine

## 2017-09-01 DIAGNOSIS — I6359 Cerebral infarction due to unspecified occlusion or stenosis of other cerebral artery: Secondary | ICD-10-CM | POA: Diagnosis not present

## 2017-09-01 DIAGNOSIS — J690 Pneumonitis due to inhalation of food and vomit: Secondary | ICD-10-CM | POA: Diagnosis not present

## 2017-09-01 DIAGNOSIS — K219 Gastro-esophageal reflux disease without esophagitis: Secondary | ICD-10-CM | POA: Diagnosis not present

## 2017-09-01 DIAGNOSIS — J9621 Acute and chronic respiratory failure with hypoxia: Secondary | ICD-10-CM | POA: Diagnosis not present

## 2017-09-01 LAB — HEPATITIS B SURFACE ANTIGEN: Hepatitis B Surface Ag: NEGATIVE

## 2017-09-01 LAB — HEPATITIS C ANTIBODY

## 2017-09-01 LAB — HEPATITIS B CORE ANTIBODY, IGM: HEP B C IGM: NEGATIVE

## 2017-09-01 LAB — HEPATITIS A ANTIBODY, IGM: HEP A IGM: NEGATIVE

## 2017-09-01 NOTE — Progress Notes (Signed)
Pulmonary Critical Care Medicine [facility] [place]   PULMONARY SERVICE  PROGRESS NOTE  Date of Service: 09/01/2017  Isaac Abbott  ZOX:[id number]  DOB: 10-10-1945   DOA: 08/10/2017  Referring Physician: Carron Curie, MD  HPI: Isaac Abbott is a 72 y.o. male seen for follow up of Acute on Chronic Respiratory Failure.  Collar this morning is been doing well.  Sitting up in the chair.  Secretions are still reportedly copious  Medications: Reviewed on Rounds  Physical Exam:  Vitals: Temperature 97.4 pulse 78 respiratory rate 21 blood pressure 127/70 saturations 95%  Ventilator Settings off the ventilator on T collar 30% FiO2  . General: Comfortable at this time . Eyes: Grossly normal lids, irises & conjunctiva . ENT: grossly tongue is normal . Neck: no obvious mass . Cardiovascular: S1 S2 normal no gallop . Respiratory: No rhonchi noted . Abdomen: soft . Skin: no rash seen on limited exam . Musculoskeletal: not rigid . Psychiatric:unable to assess . Neurologic: no seizure no involuntary movements         Labs on Admission:  Basic Metabolic Panel: Recent Labs  Lab 08/29/17 0523  NA 132*  K 3.7  CL 91*  CO2 30  GLUCOSE 158*  BUN 18  CREATININE 0.79  CALCIUM 8.4*    Liver Function Tests: Recent Labs  Lab 08/29/17 0523  AST 46*  ALT 191*  ALKPHOS 370*  BILITOT 1.1  PROT 5.8*  ALBUMIN 2.3*   No results for input(s): LIPASE, AMYLASE in the last 168 hours. No results for input(s): AMMONIA in the last 168 hours.  CBC: Recent Labs  Lab 08/29/17 0523  WBC 5.7  HGB 13.6  HCT 40.5  MCV 90.4  PLT 263    Cardiac Enzymes: No results for input(s): CKTOTAL, CKMB, CKMBINDEX, TROPONINI in the last 168 hours.  BNP (last 3 results) No results for input(s): BNP in the last 8760 hours.  ProBNP (last 3 results) No results for input(s): PROBNP in the last 8760 hours.  Radiological Exams on Admission: US Abdomen Limited Ruq  Result Date:  08/30/2017 CLINICAL DATA:  Cholecystitis. EXAM: ULTRASOUND ABDOMEN LIMITED RIGHT UPPER QUADRANT COMPARISON:  CT scan of Aug 15, 2017. FINDINGS: Gallbladder: No gallstones or wall thickening visualized. No sonographic Murphy sign noted by sonographer. Sludge is noted within gallbladder lumen. Common bile duct: Diameter: 3.3 mm which is within normal limits. Liver: No focal lesion identified. Within normal limits in parenchymal echogenicity. Portal vein is patent on color Doppler imaging with normal direction of blood flow towards the liver. IMPRESSION: Sludge is noted within gallbladder lumen. No other abnormality seen in the right upper quadrant of the abdomen. Electronically Signed   By: Lupita Raider, M.D.   On: 08/30/2017 07:03    Assessment/Plan Active Problems:   Aspiration pneumonia (HCC)   Acute on chronic respiratory failure with hypoxia (HCC)   GERD (gastroesophageal reflux disease)   Stroke (cerebrum) (HCC)   Tracheostomy status (HCC)   1. Acute on chronic respiratory failure with hypoxia we will continue with T collar trials titrate oxygen as tolerated continue pulmonary toilet supportive care 2. GERD stable at this time we will continue to follow 3. Stroke at baseline continue with restorative therapy 4. Pneumonia due to aspiration patient is n.p.o. 5. Tracheostomy will remain in place   I have personally seen and evaluated the patient, evaluated laboratory and imaging results, formulated the assessment and plan and placed orders. The Patient requires high complexity decision making for assessment and support.  Case was discussed on Rounds with the Respiratory Therapy Staff  Allyne Gee, MD [facility] Pulmonary Critical Care Medicine Sleep Medicine

## 2017-09-02 ENCOUNTER — Other Ambulatory Visit (HOSPITAL_COMMUNITY): Payer: Medicaid Other

## 2017-09-02 DIAGNOSIS — K219 Gastro-esophageal reflux disease without esophagitis: Secondary | ICD-10-CM | POA: Diagnosis not present

## 2017-09-02 DIAGNOSIS — J690 Pneumonitis due to inhalation of food and vomit: Secondary | ICD-10-CM | POA: Diagnosis not present

## 2017-09-02 DIAGNOSIS — I6359 Cerebral infarction due to unspecified occlusion or stenosis of other cerebral artery: Secondary | ICD-10-CM | POA: Diagnosis not present

## 2017-09-02 DIAGNOSIS — J9621 Acute and chronic respiratory failure with hypoxia: Secondary | ICD-10-CM | POA: Diagnosis not present

## 2017-09-02 NOTE — Progress Notes (Signed)
Pulmonary Critical Care Medicine [facility] [place]   PULMONARY SERVICE  PROGRESS NOTE  Date of Service: 09/02/2017  Isaac Abbott  ZOX:[id number]  DOB: March 26, 1946   DOA: 08/10/2017  Referring Physician: Carron Curie, MD  HPI: Isaac Abbott is a 72 y.o. male seen for follow up of Acute on Chronic Respiratory Failure.  Continues to remain off the ventilator on her/T-piece.  Secretions are reportedly very copious.  Patient is requiring frequent suctioning.  Medications: Reviewed on Rounds  Physical Exam:  Vitals: Temperature 96.1 pulse 72 respiratory rate 18 blood pressure 150/76 saturations 94%.  Ventilator Settings currently off the ventilator on T piece is on 30% oxygen  . General: Comfortable at this time . Eyes: Grossly normal lids, irises & conjunctiva . ENT: grossly tongue is normal . Neck: no obvious mass . Cardiovascular: S1 S2 normal no gallop . Respiratory: No rhonchi expansion is equal . Abdomen: soft . Skin: no rash seen on limited exam . Musculoskeletal: not rigid . Psychiatric:unable to assess . Neurologic: no seizure no involuntary movements         Labs on Admission:  Basic Metabolic Panel: Recent Labs  Lab 08/29/17 0523  NA 132*  K 3.7  CL 91*  CO2 30  GLUCOSE 158*  BUN 18  CREATININE 0.79  CALCIUM 8.4*    Liver Function Tests: Recent Labs  Lab 08/29/17 0523  AST 46*  ALT 191*  ALKPHOS 370*  BILITOT 1.1  PROT 5.8*  ALBUMIN 2.3*   No results for input(s): LIPASE, AMYLASE in the last 168 hours. No results for input(s): AMMONIA in the last 168 hours.  CBC: Recent Labs  Lab 08/29/17 0523  WBC 5.7  HGB 13.6  HCT 40.5  MCV 90.4  PLT 263    Cardiac Enzymes: No results for input(s): CKTOTAL, CKMB, CKMBINDEX, TROPONINI in the last 168 hours.  BNP (last 3 results) No results for input(s): BNP in the last 8760 hours.  ProBNP (last 3 results) No results for input(s): PROBNP in the last 8760 hours.  Radiological Exams on  Admission: Ct Head Wo Contrast  Result Date: 09/02/2017 CLINICAL DATA:  Headache.  History of stroke. EXAM: CT HEAD WITHOUT CONTRAST TECHNIQUE: Contiguous axial images were obtained from the base of the skull through the vertex without intravenous contrast. COMPARISON:  None. FINDINGS: BRAIN: No intraparenchymal hemorrhage, mass effect nor midline shift. Multiple old large cerebellar and RIGHT pontine infarcts. Old LEFT thalamus small infarcts. Patchy supratentorial white matter hypodensities. No acute large vascular territory infarcts. No abnormal extra-axial fluid collections. Basal cisterns are patent. VASCULAR: Mild calcific atherosclerosis of the carotid siphons. SKULL: No skull fracture. 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 SINUSES/ORBITS: Bilateral middle ear and mastoid effusion with air cell coalescence. Included ocular globes and orbital contents are non-suspicious. OTHER: Patient is edentulous. IMPRESSION: 1. No acute intracranial process. 2. 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 Old small LEFT cerebellar infarct. 3. Mild chronic small vessel ischemic changes. 4. Bilateral chronic otomastoiditis. Electronically Signed   By: Awilda Metro M.D.   On: 09/02/2017 02:10   US Abdomen Limited Ruq  Result Date: 08/30/2017 CLINICAL DATA:  Cholecystitis. EXAM: ULTRASOUND ABDOMEN LIMITED RIGHT UPPER QUADRANT COMPARISON:  CT scan of Aug 15, 2017. FINDINGS: Gallbladder: No gallstones or wall thickening visualized. No sonographic Murphy sign noted by sonographer. Sludge is noted within gallbladder lumen. Common bile duct: Diameter: 3.3 mm which is within normal limits. Liver: No focal lesion identified. Within normal limits in  parenchymal echogenicity. Portal vein is patent on color Doppler imaging with normal direction of blood flow towards the liver. IMPRESSION: Sludge is noted within gallbladder lumen. No other abnormality seen in the right upper quadrant of the  abdomen. Electronically Signed   By: Lupita Raider, M.D.   On: 08/30/2017 07:03    Assessment/Plan Active Problems:   Aspiration pneumonia (HCC)   Acute on chronic respiratory failure with hypoxia (HCC)   GERD (gastroesophageal reflux disease)   Stroke (cerebrum) (HCC)   Tracheostomy status (HCC)   1. Acute on chronic respiratory failure with hypoxia we will continue with T-piece trials at this time.  I do not have any plans to decannulate this patient because of excessive secretions and inability to handle the secretions. 2. Aspiration pneumonia we will continue supportive care 3. GERD at baseline 4. Stroke patient is at baseline we will continue restorative therapy 5. Tracheostomy is indicated will remain in place   I have personally seen and evaluated the patient, evaluated laboratory and imaging results, formulated the assessment and plan and placed orders. The Patient requires high complexity decision making for assessment and support.  Case was discussed on Rounds with the Respiratory Therapy Staff  Yevonne Pax, MD [facility] Pulmonary Critical Care Medicine Sleep Medicine

## 2017-09-05 DIAGNOSIS — K219 Gastro-esophageal reflux disease without esophagitis: Secondary | ICD-10-CM | POA: Diagnosis not present

## 2017-09-05 DIAGNOSIS — I6359 Cerebral infarction due to unspecified occlusion or stenosis of other cerebral artery: Secondary | ICD-10-CM | POA: Diagnosis not present

## 2017-09-05 DIAGNOSIS — J9621 Acute and chronic respiratory failure with hypoxia: Secondary | ICD-10-CM | POA: Diagnosis not present

## 2017-09-05 DIAGNOSIS — J690 Pneumonitis due to inhalation of food and vomit: Secondary | ICD-10-CM | POA: Diagnosis not present

## 2017-09-05 LAB — COMPREHENSIVE METABOLIC PANEL
ALBUMIN: 2.7 g/dL — AB (ref 3.5–5.0)
ALT: 237 U/L — ABNORMAL HIGH (ref 17–63)
AST: 61 U/L — AB (ref 15–41)
Alkaline Phosphatase: 328 U/L — ABNORMAL HIGH (ref 38–126)
Anion gap: 11 (ref 5–15)
BILIRUBIN TOTAL: 0.7 mg/dL (ref 0.3–1.2)
BUN: 19 mg/dL (ref 6–20)
CHLORIDE: 96 mmol/L — AB (ref 101–111)
CO2: 29 mmol/L (ref 22–32)
Calcium: 8.9 mg/dL (ref 8.9–10.3)
Creatinine, Ser: 0.78 mg/dL (ref 0.61–1.24)
GFR calc Af Amer: >60 mL/min (ref >60)
GFR calc non Af Amer: >60 mL/min (ref >60)
GLUCOSE: 104 mg/dL — AB (ref 65–99)
POTASSIUM: 3.9 mmol/L (ref 3.5–5.1)
Sodium: 136 mmol/L (ref 135–145)
Total Protein: 6.1 g/dL — ABNORMAL LOW (ref 6.5–8.1)

## 2017-09-05 LAB — CBC
HEMATOCRIT: 42.6 % (ref 39.0–52.0)
Hemoglobin: 14 g/dL (ref 13.0–17.0)
MCH: 29.7 pg (ref 26.0–34.0)
MCHC: 32.9 g/dL (ref 30.0–36.0)
MCV: 90.4 fL (ref 78.0–100.0)
Platelets: 272 10*3/uL (ref 150–400)
RBC: 4.71 MIL/uL (ref 4.22–5.81)
RDW: 13.2 % (ref 11.5–15.5)
WBC: 8.2 10*3/uL (ref 4.0–10.5)

## 2017-09-05 NOTE — Progress Notes (Signed)
Pulmonary Critical Care Medicine [facility] [place]   PULMONARY SERVICE  PROGRESS NOTE  Date of Service: 09/05/2017  Isaac Abbott  ZOX:[id number]  DOB: 08-22-1945   DOA: 08/10/2017  Referring Physician: Carron Curie, MD  HPI: Isaac Abbott is a 72 y.o. male seen for follow up of Acute on Chronic Respiratory Failure.  Looks good remains on T collar currently is on 20% oxygen good saturations are noted at this time.  Medications: Reviewed on Rounds  Physical Exam:  Vitals: Temperature 97.8 pulse 68 respiratory rate 14 blood pressure 141/73 saturations 96%  Ventilator Settings off the ventilator on T collar at this time  . General: Comfortable at this time . Eyes: Grossly normal lids, irises & conjunctiva . ENT: grossly tongue is normal . Neck: no obvious mass . Cardiovascular: S1 S2 normal no gallop . Respiratory: No rhonchi or rales expansion is equal . Abdomen: soft . Skin: no rash seen on limited exam . Musculoskeletal: not rigid . Psychiatric:unable to assess . Neurologic: no seizure no involuntary movements         Labs on Admission:  Basic Metabolic Panel: Recent Labs  Lab 09/05/17 0432  NA 136  K 3.9  CL 96*  CO2 29  GLUCOSE 104*  BUN 19  CREATININE 0.78  CALCIUM 8.9    Liver Function Tests: Recent Labs  Lab 09/05/17 0432  AST 61*  ALT 237*  ALKPHOS 328*  BILITOT 0.7  PROT 6.1*  ALBUMIN 2.7*   No results for input(s): LIPASE, AMYLASE in the last 168 hours. No results for input(s): AMMONIA in the last 168 hours.  CBC: Recent Labs  Lab 09/05/17 0432  WBC 8.2  HGB 14.0  HCT 42.6  MCV 90.4  PLT 272    Cardiac Enzymes: No results for input(s): CKTOTAL, CKMB, CKMBINDEX, TROPONINI in the last 168 hours.  BNP (last 3 results) No results for input(s): BNP in the last 8760 hours.  ProBNP (last 3 results) No results for input(s): PROBNP in the last 8760 hours.  Radiological Exams on Admission: Ct Head Wo Contrast  Result Date:  09/02/2017 CLINICAL DATA:  Headache.  History of stroke. EXAM: CT HEAD WITHOUT CONTRAST TECHNIQUE: Contiguous axial images were obtained from the base of the skull through the vertex without intravenous contrast. COMPARISON:  None. FINDINGS: BRAIN: No intraparenchymal hemorrhage, mass effect nor midline shift. Multiple old large cerebellar and RIGHT pontine infarcts. Old LEFT thalamus small infarcts. Patchy supratentorial white matter hypodensities. No acute large vascular territory infarcts. No abnormal extra-axial fluid collections. Basal cisterns are patent. VASCULAR: Mild calcific atherosclerosis of the carotid siphons. SKULL: No skull fracture. 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 SINUSES/ORBITS: Bilateral middle ear and mastoid effusion with air cell coalescence. Included ocular globes and orbital contents are non-suspicious. OTHER: Patient is edentulous. IMPRESSION: 1. No acute intracranial process. 2. 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 Old small LEFT cerebellar infarct. 3. Mild chronic small vessel ischemic changes. 4. Bilateral chronic otomastoiditis. Electronically Signed   By: Awilda Metro M.D.   On: 09/02/2017 02:10    Assessment/Plan Active Problems:   Aspiration pneumonia (HCC)   Acute on chronic respiratory failure with hypoxia (HCC)   GERD (gastroesophageal reflux disease)   Stroke (cerebrum) (HCC)   Tracheostomy status (HCC)   1. Acute on chronic respiratory failure with hypoxia doing fairly well at this time we will continue with T collar trials as ordered.  Patient's at baseline secondary to increased retained secretions. 2.  Pneumonia due to aspiration treated with antibiotics we will continue to follow 3. GERD at baseline continue present therapy. 4. Stroke patient is at baseline we will continue physical therapy 5. Tracheostomy will be kept in place secondary to secretion issues   I have personally seen and evaluated the  patient, evaluated laboratory and imaging results, formulated the assessment and plan and placed orders. The Patient requires high complexity decision making for assessment and support.  Case was discussed on Rounds with the Respiratory Therapy Staff  Yevonne Pax, MD [facility] Pulmonary Critical Care Medicine Sleep Medicine

## 2017-09-06 DIAGNOSIS — J9621 Acute and chronic respiratory failure with hypoxia: Secondary | ICD-10-CM | POA: Diagnosis not present

## 2017-09-06 DIAGNOSIS — J690 Pneumonitis due to inhalation of food and vomit: Secondary | ICD-10-CM | POA: Diagnosis not present

## 2017-09-06 DIAGNOSIS — K219 Gastro-esophageal reflux disease without esophagitis: Secondary | ICD-10-CM | POA: Diagnosis not present

## 2017-09-06 DIAGNOSIS — I6359 Cerebral infarction due to unspecified occlusion or stenosis of other cerebral artery: Secondary | ICD-10-CM | POA: Diagnosis not present

## 2017-09-06 NOTE — Progress Notes (Signed)
Pulmonary Critical Care Medicine [facility] [place]   PULMONARY SERVICE  PROGRESS NOTE  Date of Service: 09/06/2017  Isaac Abbott  ONG:[id number]  DOB: 08/07/1945   DOA: 08/10/2017  Referring Physician: Carron Curie, MD  HPI: Isaac Abbott is a 72 y.o. male seen for follow up of Acute on Chronic Respiratory Failure.  Comfortable no distress at this time patient is on T collar right now patient's been on 28% oxygen good saturations are noted.  Medications: Reviewed on Rounds  Physical Exam:  Vitals: Temperature 96.8 pulse 69 respiratory rate 16 blood pressure 112/51 saturations 95%  Ventilator Settings off the ventilator on T collar  . General: Comfortable at this time . Eyes: Grossly normal lids, irises & conjunctiva . ENT: grossly tongue is normal . Neck: no obvious mass . Cardiovascular: S1 S2 normal no gallop . Respiratory: No rhonchi expansion is equal . Abdomen: soft . Skin: no rash seen on limited exam . Musculoskeletal: not rigid . Psychiatric:unable to assess . Neurologic: no seizure no involuntary movements         Labs on Admission:  Basic Metabolic Panel: Recent Labs  Lab 09/05/17 0432  NA 136  K 3.9  CL 96*  CO2 29  GLUCOSE 104*  BUN 19  CREATININE 0.78  CALCIUM 8.9    Liver Function Tests: Recent Labs  Lab 09/05/17 0432  AST 61*  ALT 237*  ALKPHOS 328*  BILITOT 0.7  PROT 6.1*  ALBUMIN 2.7*   No results for input(s): LIPASE, AMYLASE in the last 168 hours. No results for input(s): AMMONIA in the last 168 hours.  CBC: Recent Labs  Lab 09/05/17 0432  WBC 8.2  HGB 14.0  HCT 42.6  MCV 90.4  PLT 272    Cardiac Enzymes: No results for input(s): CKTOTAL, CKMB, CKMBINDEX, TROPONINI in the last 168 hours.  BNP (last 3 results) No results for input(s): BNP in the last 8760 hours.  ProBNP (last 3 results) No results for input(s): PROBNP in the last 8760 hours.  Radiological Exams on Admission: No results  found.  Assessment/Plan Active Problems:   Aspiration pneumonia (HCC)   Acute on chronic respiratory failure with hypoxia (HCC)   GERD (gastroesophageal reflux disease)   Stroke (cerebrum) (HCC)   Tracheostomy status (HCC)   1. Acute on chronic respiratory failure with hypoxia we will continue with her/T collar change the cuffless trach.  Continue secretion management pulmonary toilet. 2. Pneumonia due to aspiration will continue present management has been treated 3. GERD stable 4. Stroke continue with physical therapy 5. Tracheostomy will need long-term   I have personally seen and evaluated the patient, evaluated laboratory and imaging results, formulated the assessment and plan and placed orders. The Patient requires high complexity decision making for assessment and support.  Case was discussed on Rounds with the Respiratory Therapy Staff  Yevonne Pax, MD [facility] Pulmonary Critical Care Medicine Sleep Medicine

## 2017-09-07 DIAGNOSIS — I6359 Cerebral infarction due to unspecified occlusion or stenosis of other cerebral artery: Secondary | ICD-10-CM | POA: Diagnosis not present

## 2017-09-07 DIAGNOSIS — J9621 Acute and chronic respiratory failure with hypoxia: Secondary | ICD-10-CM | POA: Diagnosis not present

## 2017-09-07 DIAGNOSIS — K219 Gastro-esophageal reflux disease without esophagitis: Secondary | ICD-10-CM | POA: Diagnosis not present

## 2017-09-07 DIAGNOSIS — J690 Pneumonitis due to inhalation of food and vomit: Secondary | ICD-10-CM | POA: Diagnosis not present

## 2017-09-07 NOTE — Progress Notes (Signed)
Pulmonary Critical Care Medicine [facility] [place]   PULMONARY SERVICE  PROGRESS NOTE  Date of Service: 09/07/2017  [name]  WUJ:[id number]  DOB: 18-Feb-1946   DOA: 08/10/2017  Referring Physician: Carron Curie, MD  HPI: Isaac Abbott is a 72 y.o. male seen for follow up of Acute on Chronic Respiratory Failure.  Patient is comfortable without distress is on T collar at baseline right now.  Secretions are fair to moderate  Medications: Reviewed on Rounds  Physical Exam:  Vitals: Temperature 97.0 pulse 81 respiratory rate 16 blood pressure 144/50 saturations 99%  Ventilator Settings on T collar trials will be continued  . General: Comfortable at this time . Eyes: Grossly normal lids, irises & conjunctiva . ENT: grossly tongue is normal . Neck: no obvious mass . Cardiovascular: S1 S2 normal no gallop . Respiratory: No rhonchi or rales expansion is equal . Abdomen: soft . Skin: no rash seen on limited exam . Musculoskeletal: not rigid . Psychiatric:unable to assess . Neurologic: no seizure no involuntary movements         Labs on Admission:  Basic Metabolic Panel: Recent Labs  Lab 09/05/17 0432  NA 136  K 3.9  CL 96*  CO2 29  GLUCOSE 104*  BUN 19  CREATININE 0.78  CALCIUM 8.9    Liver Function Tests: Recent Labs  Lab 09/05/17 0432  AST 61*  ALT 237*  ALKPHOS 328*  BILITOT 0.7  PROT 6.1*  ALBUMIN 2.7*   No results for input(s): LIPASE, AMYLASE in the last 168 hours. No results for input(s): AMMONIA in the last 168 hours.  CBC: Recent Labs  Lab 09/05/17 0432  WBC 8.2  HGB 14.0  HCT 42.6  MCV 90.4  PLT 272    Cardiac Enzymes: No results for input(s): CKTOTAL, CKMB, CKMBINDEX, TROPONINI in the last 168 hours.  BNP (last 3 results) No results for input(s): BNP in the last 8760 hours.  ProBNP (last 3 results) No results for input(s): PROBNP in the last 8760 hours.  Radiological Exams on Admission: No results  found.  Assessment/Plan Active Problems:   Aspiration pneumonia (HCC)   Acute on chronic respiratory failure with hypoxia (HCC)   GERD (gastroesophageal reflux disease)   Stroke (cerebrum) (HCC)   Tracheostomy status (HCC)   1. Acute on chronic respiratory failure with hypoxia we will continue with T collar secretions are still significant continue pulmonary toilet supportive care we will monitor closely. 2. Aspiration pneumonia treated with antibiotics we will continue to follow 3. GERD at baseline 4. Stroke continue restorative therapy 5. Tracheostomy will be needed long-term   I have personally seen and evaluated the patient, evaluated laboratory and imaging results, formulated the assessment and plan and placed orders. The Patient requires high complexity decision making for assessment and support.  Case was discussed on Rounds with the Respiratory Therapy Staff  Yevonne Pax, MD [facility] Pulmonary Critical Care Medicine Sleep Medicine

## 2017-09-08 DIAGNOSIS — K219 Gastro-esophageal reflux disease without esophagitis: Secondary | ICD-10-CM | POA: Diagnosis not present

## 2017-09-08 DIAGNOSIS — Z930 Tracheostomy status: Secondary | ICD-10-CM | POA: Diagnosis not present

## 2017-09-08 DIAGNOSIS — J690 Pneumonitis due to inhalation of food and vomit: Secondary | ICD-10-CM | POA: Diagnosis not present

## 2017-09-08 DIAGNOSIS — J9621 Acute and chronic respiratory failure with hypoxia: Secondary | ICD-10-CM | POA: Diagnosis not present

## 2017-09-08 DIAGNOSIS — I6359 Cerebral infarction due to unspecified occlusion or stenosis of other cerebral artery: Secondary | ICD-10-CM | POA: Diagnosis not present

## 2017-09-08 NOTE — Progress Notes (Signed)
Pulmonary Critical Care Medicine [facility] [place]   PULMONARY SERVICE  PROGRESS NOTE  Date of Service: 09/08/2017  Isaac Abbott  ZOX:[id number]  DOB: 1945/10/05   DOA: 08/10/2017  Referring Physician: Carron Curie, MD  HPI: Isaac Abbott is a 72 y.o. male seen for follow up of Acute on Chronic Respiratory Failure.  Remains on T collar at this time is been on 28% oxygen no distress is noted.  Patient is tolerating current settings fairly well  Medications: Reviewed on Rounds  Physical Exam:  Vitals: Temperature 97.1 pulse 78 respiratory 18 blood pressure 116/68 saturations 97%  Ventilator Settings currently off the ventilator on T collar has been on 20% FiO2  . General: Comfortable at this time . Eyes: Grossly normal lids, irises & conjunctiva . ENT: grossly tongue is normal . Neck: no obvious mass . Cardiovascular: S1 S2 normal no gallop . Respiratory: No rhonchi or rales expansion is equal . Abdomen: soft . Skin: no rash seen on limited exam . Musculoskeletal: not rigid . Psychiatric:unable to assess . Neurologic: no seizure no involuntary movements         Labs on Admission:  Basic Metabolic Panel: Recent Labs  Lab 09/05/17 0432  NA 136  K 3.9  CL 96*  CO2 29  GLUCOSE 104*  BUN 19  CREATININE 0.78  CALCIUM 8.9    Liver Function Tests: Recent Labs  Lab 09/05/17 0432  AST 61*  ALT 237*  ALKPHOS 328*  BILITOT 0.7  PROT 6.1*  ALBUMIN 2.7*   No results for input(s): LIPASE, AMYLASE in the last 168 hours. No results for input(s): AMMONIA in the last 168 hours.  CBC: Recent Labs  Lab 09/05/17 0432  WBC 8.2  HGB 14.0  HCT 42.6  MCV 90.4  PLT 272    Cardiac Enzymes: No results for input(s): CKTOTAL, CKMB, CKMBINDEX, TROPONINI in the last 168 hours.  BNP (last 3 results) No results for input(s): BNP in the last 8760 hours.  ProBNP (last 3 results) No results for input(s): PROBNP in the last 8760 hours.  Radiological Exams on  Admission: No results found.  Assessment/Plan Active Problems:   Aspiration pneumonia (HCC)   Acute on chronic respiratory failure with hypoxia (HCC)   GERD (gastroesophageal reflux disease)   Stroke (cerebrum) (HCC)   Tracheostomy status (HCC)   1. Acute on chronic respiratory failure with hypoxia doing fine with T collar patient does have baseline secretions are fair to moderate.  We will continue pulmonary toilet supportive care. 2. Pneumonia due to aspiration right now is afebrile we will continue to monitor. 3. Stroke restorative therapy as tolerated 4. Tracheostomy will be continued not to be decannulated 5. GERD at baseline   I have personally seen and evaluated the patient, evaluated laboratory and imaging results, formulated the assessment and plan and placed orders. The Patient requires high complexity decision making for assessment and support.  Case was discussed on Rounds with the Respiratory Therapy Staff  Yevonne Pax, MD [facility] Pulmonary Critical Care Medicine Sleep Medicine

## 2017-09-09 DIAGNOSIS — K219 Gastro-esophageal reflux disease without esophagitis: Secondary | ICD-10-CM | POA: Diagnosis not present

## 2017-09-09 DIAGNOSIS — J9621 Acute and chronic respiratory failure with hypoxia: Secondary | ICD-10-CM | POA: Diagnosis not present

## 2017-09-09 DIAGNOSIS — J690 Pneumonitis due to inhalation of food and vomit: Secondary | ICD-10-CM | POA: Diagnosis not present

## 2017-09-09 DIAGNOSIS — I6359 Cerebral infarction due to unspecified occlusion or stenosis of other cerebral artery: Secondary | ICD-10-CM | POA: Diagnosis not present

## 2017-09-09 DIAGNOSIS — Z930 Tracheostomy status: Secondary | ICD-10-CM | POA: Diagnosis not present

## 2017-09-09 NOTE — Progress Notes (Signed)
Pulmonary Critical Care Medicine [facility] [place]   PULMONARY SERVICE  PROGRESS NOTE  Date of Service: 09/09/2017  Isaac Abbott  ZOX:[id number]  DOB: [birth year]   DOA: 08/10/2017  Referring Physician: Carron Curie, MD  HPI: Isaac Abbott is a 72 y.o. male seen for follow up of Acute on Chronic Respiratory Failure.  Remains on T collar currently is on 28% oxygen has been tolerating the PMV fairly well also.  Medications: Reviewed on Rounds  Physical Exam:  Vitals: Temperature 97.8 pulse 87 respiratory rate 17 blood pressure 106/53 saturations 98%  Ventilator Settings off the ventilator on T collar  . General: Comfortable at this time . Eyes: Grossly normal lids, irises & conjunctiva . ENT: grossly tongue is normal . Neck: no obvious mass . Cardiovascular: S1 S2 normal no gallop . Respiratory: No rhonchi noted . Abdomen: soft . Skin: no rash seen on limited exam . Musculoskeletal: not rigid . Psychiatric:unable to assess . Neurologic: no seizure no involuntary movements         Labs on Admission:  Basic Metabolic Panel: Recent Labs  Lab 09/05/17 0432  NA 136  K 3.9  CL 96*  CO2 29  GLUCOSE 104*  BUN 19  CREATININE 0.78  CALCIUM 8.9    Liver Function Tests: Recent Labs  Lab 09/05/17 0432  AST 61*  ALT 237*  ALKPHOS 328*  BILITOT 0.7  PROT 6.1*  ALBUMIN 2.7*   No results for input(s): LIPASE, AMYLASE in the last 168 hours. No results for input(s): AMMONIA in the last 168 hours.  CBC: Recent Labs  Lab 09/05/17 0432  WBC 8.2  HGB 14.0  HCT 42.6  MCV 90.4  PLT 272    Cardiac Enzymes: No results for input(s): CKTOTAL, CKMB, CKMBINDEX, TROPONINI in the last 168 hours.  BNP (last 3 results) No results for input(s): BNP in the last 8760 hours.  ProBNP (last 3 results) No results for input(s): PROBNP in the last 8760 hours.  Radiological Exams on Admission: No results found.  Assessment/Plan Active Problems:   Aspiration  pneumonia (HCC)   Acute on chronic respiratory failure with hypoxia (HCC)   GERD (gastroesophageal reflux disease)   Stroke (cerebrum) (HCC)   Tracheostomy status (HCC)   1. Acute on chronic respiratory failure with hypoxia we will continue with T collar trials patient's at baseline.  Continue to attempt PMV as tolerated continue pulmonary toilet supportive care 2. Pneumonia due to aspiration right now stable continue to monitor 3. Stroke at baseline physical therapy 4. GERD stable 5. Tracheostomy will be kept and as already noted previously   I have personally seen and evaluated the patient, evaluated laboratory and imaging results, formulated the assessment and plan and placed orders. The Patient requires high complexity decision making for assessment and support.  Case was discussed on Rounds with the Respiratory Therapy Staff  Yevonne Pax, MD [facility] Pulmonary Critical Care Medicine Sleep Medicine

## 2017-09-10 DIAGNOSIS — J690 Pneumonitis due to inhalation of food and vomit: Secondary | ICD-10-CM | POA: Diagnosis not present

## 2017-09-10 DIAGNOSIS — J9621 Acute and chronic respiratory failure with hypoxia: Secondary | ICD-10-CM | POA: Diagnosis not present

## 2017-09-10 DIAGNOSIS — K219 Gastro-esophageal reflux disease without esophagitis: Secondary | ICD-10-CM | POA: Diagnosis not present

## 2017-09-10 DIAGNOSIS — I6359 Cerebral infarction due to unspecified occlusion or stenosis of other cerebral artery: Secondary | ICD-10-CM | POA: Diagnosis not present

## 2017-09-10 NOTE — Progress Notes (Signed)
P[facility] [place]   PULMONARY SERVICE  PROGRESS NOTE  Date of Service: 09/10/2017  Isaac Abbott Pal[place]er  ZOX:[id number]:[id number]  DOB: 1945/06/27   DOA: 08/10/2017  Referring Physician: Carron CurieAli Hijazi, MD  HPI: Isaac Abbott Aydt is a 72 y.o. male seen for follow up of Acute on Chronic Respiratory Failure.  Patient remains on T collar currently is on 28% FiO2  Medications: Reviewed on Rounds  Physical Exam:  Vitals: Temperature 97.1 pulse 74 respiratory rate 16 blood pressure 127/76 saturations 95%  Ventilator Settings off the ventilator on T collar  . General: Comfortable at this time . Eyes: Grossly normal lids, irises & conjunctiva . ENT: grossly tongue is normal . Neck: no obvious mass . Cardiovascular: S1 S2 normal no gallop . Respiratory: No rhonchi expansion equal . Abdomen: soft . Skin: no rash seen on limited exam . Musculoskeletal: not rigid . Psychiatric:unable to assess . Neurologic: no seizure no involuntary movements         Labs on Admission:  Basic Metabolic Panel: Recent Labs  Lab 09/05/17 0432  NA 136  K 3.9  CL 96*  CO2 29  GLUCOSE 104*  BUN 19  CREATININE 0.78  CALCIUM 8.9    Liver Function Tests: Recent Labs  Lab 09/05/17 0432  AST 61*  ALT 237*  ALKPHOS 328*  BILITOT 0.7  PROT 6.1*  ALBUMIN 2.7*   No results for input(s): LIPASE, AMYLASE in the last 168 hours. No results for input(s): AMMONIA in the last 168 hours.  CBC: Recent Labs  Lab 09/05/17 0432  WBC 8.2  HGB 14.0  HCT 42.6  MCV 90.4  PLT 272    Cardiac Enzymes: No results for input(s): CKTOTAL, CKMB, CKMBINDEX, TROPONINI in the last 168 hours.  BNP (last 3 results) No results for input(s): BNP in the last 8760 hours.  ProBNP (last 3 results) No results for input(s): PROBNP in the last 8760 hours.  Radiological Exams on Admission: No results found.  Assessment/Plan Active Problems:   Aspiration pneumonia (HCC)   Acute on chronic  respiratory failure with hypoxia (HCC)   GERD (gastroesophageal reflux disease)   Stroke (cerebrum) (HCC)   Tracheostomy status (HCC)   1. Acute on chronic respiratory failure with hypoxia continue with T collar trials patient's at baseline right now secretions are fair to moderate and unable to decannulate because of secretion issues. 2. GERD at baseline we will continue present therapy. 3. Stroke status post stroke continue physical therapy 4. Pneumonia due to aspiration stable at this time we will continue with the trach 5. Tracheostomy as above   I have personally seen and evaluated the patient, evaluated laboratory and imaging results, formulated the assessment and plan and placed orders. The Patient requires high complexity decision making for assessment and support.  Case was discussed on Rounds with the Respiratory Therapy Staff  Yevonne PaxSaadat A Merit Maybee, MD [facility] Pulmonary Critical Care Medicine Sleep Medicine

## 2017-09-11 DIAGNOSIS — J690 Pneumonitis due to inhalation of food and vomit: Secondary | ICD-10-CM | POA: Diagnosis not present

## 2017-09-11 DIAGNOSIS — K219 Gastro-esophageal reflux disease without esophagitis: Secondary | ICD-10-CM | POA: Diagnosis not present

## 2017-09-11 DIAGNOSIS — I6359 Cerebral infarction due to unspecified occlusion or stenosis of other cerebral artery: Secondary | ICD-10-CM | POA: Diagnosis not present

## 2017-09-11 DIAGNOSIS — J9621 Acute and chronic respiratory failure with hypoxia: Secondary | ICD-10-CM | POA: Diagnosis not present

## 2017-09-11 LAB — BASIC METABOLIC PANEL
ANION GAP: 7 (ref 5–15)
BUN: 26 mg/dL — ABNORMAL HIGH (ref 6–20)
CALCIUM: 8.8 mg/dL — AB (ref 8.9–10.3)
CO2: 31 mmol/L (ref 22–32)
CREATININE: 0.9 mg/dL (ref 0.61–1.24)
Chloride: 98 mmol/L — ABNORMAL LOW (ref 101–111)
GFR calc non Af Amer: >60 mL/min (ref >60)
Glucose, Bld: 116 mg/dL — ABNORMAL HIGH (ref 65–99)
Potassium: 3.7 mmol/L (ref 3.5–5.1)
SODIUM: 136 mmol/L (ref 135–145)

## 2017-09-11 LAB — CBC
HCT: 42.8 % (ref 39.0–52.0)
HEMOGLOBIN: 14.1 g/dL (ref 13.0–17.0)
MCH: 30.3 pg (ref 26.0–34.0)
MCHC: 32.9 g/dL (ref 30.0–36.0)
MCV: 92 fL (ref 78.0–100.0)
Platelets: 207 10*3/uL (ref 150–400)
RBC: 4.65 MIL/uL (ref 4.22–5.81)
RDW: 14.3 % (ref 11.5–15.5)
WBC: 6.1 10*3/uL (ref 4.0–10.5)

## 2017-09-11 NOTE — Progress Notes (Signed)
[facility] [place]   PULMONARY SERVICE  PROGRESS NOTE  Date of Service: 09/11/2017  Danielle RankinBarney Halabi  WUJ:[id number]:[id number]  DOB: October 15, 1945   DOA: 08/10/2017  Referring Physician: Carron CurieAli Hijazi, MD  HPI: Danielle RankinBarney Bianca is a 72 y.o. male seen for follow up of Acute on Chronic Respiratory Failure.  Patient is awake alert comfortable without distress.  Remains on T collar has been on 28% FiO2  Medications: Reviewed on Rounds  Physical Exam:  Vitals: Temperature 97.6 pulse 72 respiratory rate 20 blood pressure 136/75 saturations 98%  Ventilator Settings off the ventilator on T collar trials  . General: Comfortable at this time . Eyes: Grossly normal lids, irises & conjunctiva . ENT: grossly tongue is normal . Neck: no obvious mass . Cardiovascular: S1 S2 normal no gallop . Respiratory: No rhonchi expansion is equal . Abdomen: soft . Skin: no rash seen on limited exam . Musculoskeletal: not rigid . Psychiatric:unable to assess . Neurologic: no seizure no involuntary movements         Labs on Admission:  Basic Metabolic Panel: Recent Labs  Lab 09/05/17 0432 09/11/17 0803  NA 136 136  K 3.9 3.7  CL 96* 98*  CO2 29 31  GLUCOSE 104* 116*  BUN 19 26*  CREATININE 0.78 0.90  CALCIUM 8.9 8.8*    Liver Function Tests: Recent Labs  Lab 09/05/17 0432  AST 61*  ALT 237*  ALKPHOS 328*  BILITOT 0.7  PROT 6.1*  ALBUMIN 2.7*   No results for input(s): LIPASE, AMYLASE in the last 168 hours. No results for input(s): AMMONIA in the last 168 hours.  CBC: Recent Labs  Lab 09/05/17 0432 09/11/17 0803  WBC 8.2 6.1  HGB 14.0 14.1  HCT 42.6 42.8  MCV 90.4 92.0  PLT 272 207    Cardiac Enzymes: No results for input(s): CKTOTAL, CKMB, CKMBINDEX, TROPONINI in the last 168 hours.  BNP (last 3 results) No results for input(s): BNP in the last 8760 hours.  ProBNP (last 3 results) No results for input(s): PROBNP in the last 8760  hours.  Radiological Exams on Admission: No results found.  Assessment/Plan Active Problems:   Aspiration pneumonia (HCC)   Acute on chronic respiratory failure with hypoxia (HCC)   GERD (gastroesophageal reflux disease)   Stroke (cerebrum) (HCC)   Tracheostomy status (HCC)   1. Acute on chronic respiratory failure with hypoxia continue with T collar trials as ordered.  Will titrate oxygen continue with aggressive pulmonary toilet. 2. Pneumonia due to aspiration treated doing much better right now. 3. GERD stable at this time we will continue present therapy 4. Stroke at baseline we will continue to follow 5. Tracheostomy will continue with supportive care monitor secretions   I have personally seen and evaluated the patient, evaluated laboratory and imaging results, formulated the assessment and plan and placed orders. The Patient requires high complexity decision making for assessment and support.  Case was discussed on Rounds with the Respiratory Therapy Staff  Yevonne PaxSaadat A Marcia Hartwell, MD [facility] Pulmonary Critical Care Medicine Sleep Medicine

## 2017-09-12 DIAGNOSIS — I6359 Cerebral infarction due to unspecified occlusion or stenosis of other cerebral artery: Secondary | ICD-10-CM | POA: Diagnosis not present

## 2017-09-12 DIAGNOSIS — J9621 Acute and chronic respiratory failure with hypoxia: Secondary | ICD-10-CM | POA: Diagnosis not present

## 2017-09-12 DIAGNOSIS — K219 Gastro-esophageal reflux disease without esophagitis: Secondary | ICD-10-CM | POA: Diagnosis not present

## 2017-09-12 DIAGNOSIS — J690 Pneumonitis due to inhalation of food and vomit: Secondary | ICD-10-CM | POA: Diagnosis not present

## 2017-09-12 LAB — COMPREHENSIVE METABOLIC PANEL
ALK PHOS: 164 U/L — AB (ref 38–126)
ALT: 113 U/L — AB (ref 17–63)
AST: 32 U/L (ref 15–41)
Albumin: 2.9 g/dL — ABNORMAL LOW (ref 3.5–5.0)
Anion gap: 8 (ref 5–15)
BUN: 24 mg/dL — AB (ref 6–20)
CALCIUM: 8.6 mg/dL — AB (ref 8.9–10.3)
CO2: 27 mmol/L (ref 22–32)
CREATININE: 0.88 mg/dL (ref 0.61–1.24)
Chloride: 102 mmol/L (ref 101–111)
GFR calc non Af Amer: >60 mL/min (ref >60)
GLUCOSE: 142 mg/dL — AB (ref 65–99)
Potassium: 3.7 mmol/L (ref 3.5–5.1)
SODIUM: 137 mmol/L (ref 135–145)
Total Bilirubin: 0.7 mg/dL (ref 0.3–1.2)
Total Protein: 5.4 g/dL — ABNORMAL LOW (ref 6.5–8.1)

## 2017-09-12 NOTE — Progress Notes (Signed)
Pulmonary Critical [facility] [place]   PULMONARY SERVICE  PROGRESS NOTE  Date of Service: 09/12/2017  Danielle RankinBarney Pepin  ZOX:[id number]:[id number]  DOB: 1945-12-27   DOA: 08/10/2017  Referring Physician: Carron CurieAli Hijazi, MD  HPI: Danielle RankinBarney Lahman is a 72 y.o. male seen for follow up of Acute on Chronic Respiratory Failure.  Patient right now is on T collar and has been tolerating the PMV fairly well also.  Currently is on 28% oxygen without distress  Medications: Reviewed on Rounds  Physical Exam:  Vitals: Temperature 97.1 pulse 69 respiratory rate 18 blood pressure 124/70 saturations 98%  Ventilator Settings off the ventilator on T collar 28% FiO2  . General: Comfortable at this time . Eyes: Grossly normal lids, irises & conjunctiva . ENT: grossly tongue is normal . Neck: no obvious mass . Cardiovascular: S1 S2 normal no gallop . Respiratory: Good air entry no rhonchi . Abdomen: soft . Skin: no rash seen on limited exam . Musculoskeletal: not rigid . Psychiatric:unable to assess . Neurologic: no seizure no involuntary movements         Lab Data:   Basic Metabolic Panel: Recent Labs  Lab 09/11/17 0803 09/12/17 0547  NA 136 137  K 3.7 3.7  CL 98* 102  CO2 31 27  GLUCOSE 116* 142*  BUN 26* 24*  CREATININE 0.90 0.88  CALCIUM 8.8* 8.6*    Liver Function Tests: Recent Labs  Lab 09/12/17 0547  AST 32  ALT 113*  ALKPHOS 164*  BILITOT 0.7  PROT 5.4*  ALBUMIN 2.9*   No results for input(s): LIPASE, AMYLASE in the last 168 hours. No results for input(s): AMMONIA in the last 168 hours.  CBC: Recent Labs  Lab 09/11/17 0803  WBC 6.1  HGB 14.1  HCT 42.8  MCV 92.0  PLT 207    Cardiac Enzymes: No results for input(s): CKTOTAL, CKMB, CKMBINDEX, TROPONINI in the last 168 hours.  BNP (last 3 results) No results for input(s): BNP in the last 8760 hours.  ProBNP (last 3 results) No results for input(s): PROBNP in the last 8760 hours.  Radiological  Exams: No results found.  Assessment/Plan Active Problems:   Aspiration pneumonia (HCC)   Acute on chronic respiratory failure with hypoxia (HCC)   GERD (gastroesophageal reflux disease)   Stroke (cerebrum) (HCC)   Tracheostomy status (HCC)   1. Acute on chronic respiratory failure with hypoxia patient is doing well at this time on T collar which will be continued he is at baseline continue pulmonary toilet secretion management supportive care 2. Pneumonia due to aspiration clinically improving we will continue to follow 3. GERD stable 4. Stroke continue with present supportive care physical therapy 5. Tracheostomy will remain in place   I have personally seen and evaluated the patient, evaluated laboratory and imaging results, formulated the assessment and plan and placed orders. The Patient requires high complexity decision making for assessment and support.  Case was discussed on Rounds with the Respiratory Therapy Staff  Yevonne PaxSaadat A Vibha Ferdig, MD [facility] Pulmonary Critical Care Medicine Sleep Medicine

## 2017-09-14 DIAGNOSIS — J690 Pneumonitis due to inhalation of food and vomit: Secondary | ICD-10-CM | POA: Diagnosis not present

## 2017-09-14 DIAGNOSIS — Z930 Tracheostomy status: Secondary | ICD-10-CM | POA: Diagnosis not present

## 2017-09-14 DIAGNOSIS — K219 Gastro-esophageal reflux disease without esophagitis: Secondary | ICD-10-CM | POA: Diagnosis not present

## 2017-09-14 DIAGNOSIS — I6359 Cerebral infarction due to unspecified occlusion or stenosis of other cerebral artery: Secondary | ICD-10-CM | POA: Diagnosis not present

## 2017-09-14 DIAGNOSIS — J9621 Acute and chronic respiratory failure with hypoxia: Secondary | ICD-10-CM | POA: Diagnosis not present

## 2017-09-14 NOTE — Progress Notes (Signed)
[facility] [place]   PULMONARY SERVICE  PROGRESS NOTE  Date of Service: 09/14/2017  Danielle RankinBarney Boerema  UJW:[id number]:[id number]  DOB: 1945-11-08   DOA: 08/10/2017  Referring Physician: Carron CurieAli Hijazi, MD  HPI: Danielle RankinBarney Bran is a 72 y.o. male seen for follow up of Acute on Chronic Respiratory Failure.  Patient is on T collar right now good saturations are noted.  Remains on 28% FiO2  Medications: Reviewed on Rounds  Physical Exam:  Vitals: Temperature 97.4 pulse 62 respiratory rate 18 blood pressure 134/68 saturations 97%  Ventilator Settings 28% FiO2 on T collar  . General: Comfortable at this time . Eyes: Grossly normal lids, irises & conjunctiva . ENT: grossly tongue is normal . Neck: no obvious mass . Cardiovascular: S1 S2 normal no gallop . Respiratory: Good air entry no rhonchi or rales . Abdomen: soft . Skin: no rash seen on limited exam . Musculoskeletal: not rigid . Psychiatric:unable to assess . Neurologic: no seizure no involuntary movements         Lab Data:   Basic Metabolic Panel: Recent Labs  Lab 09/11/17 0803 09/12/17 0547  NA 136 137  K 3.7 3.7  CL 98* 102  CO2 31 27  GLUCOSE 116* 142*  BUN 26* 24*  CREATININE 0.90 0.88  CALCIUM 8.8* 8.6*    Liver Function Tests: Recent Labs  Lab 09/12/17 0547  AST 32  ALT 113*  ALKPHOS 164*  BILITOT 0.7  PROT 5.4*  ALBUMIN 2.9*   No results for input(s): LIPASE, AMYLASE in the last 168 hours. No results for input(s): AMMONIA in the last 168 hours.  CBC: Recent Labs  Lab 09/11/17 0803  WBC 6.1  HGB 14.1  HCT 42.8  MCV 92.0  PLT 207    Cardiac Enzymes: No results for input(s): CKTOTAL, CKMB, CKMBINDEX, TROPONINI in the last 168 hours.  BNP (last 3 results) No results for input(s): BNP in the last 8760 hours.  ProBNP (last 3 results) No results for input(s): PROBNP in the last 8760 hours.  Radiological Exams: No results found.  Assessment/Plan Active  Problems:   Aspiration pneumonia (HCC)   Acute on chronic respiratory failure with hypoxia (HCC)   GERD (gastroesophageal reflux disease)   Stroke (cerebrum) (HCC)   Tracheostomy status (HCC)   1. Acute on chronic respiratory failure with hypoxia we will continue with T collar as ordered continue pulmonary toilet supportive care overall prognosis guarded. 2. Aspiration pneumonia treated doing better 3. Stroke at baseline we will continue restorative therapy 4. GERD at baseline 5. Tracheostomy will need to remain in place will likely be placed in a skilled nursing facility   I have personally seen and evaluated the patient, evaluated laboratory and imaging results, formulated the assessment and plan and placed orders. The Patient requires high complexity decision making for assessment and support.  Case was discussed on Rounds with the Respiratory Therapy Staff  Yevonne PaxSaadat A Anadalay Macdonell, MD [facility] Pulmonary Critical Care Medicine Sleep Medicine

## 2017-09-15 DIAGNOSIS — J9621 Acute and chronic respiratory failure with hypoxia: Secondary | ICD-10-CM | POA: Diagnosis not present

## 2017-09-15 DIAGNOSIS — K219 Gastro-esophageal reflux disease without esophagitis: Secondary | ICD-10-CM | POA: Diagnosis not present

## 2017-09-15 DIAGNOSIS — I6359 Cerebral infarction due to unspecified occlusion or stenosis of other cerebral artery: Secondary | ICD-10-CM | POA: Diagnosis not present

## 2017-09-15 DIAGNOSIS — J690 Pneumonitis due to inhalation of food and vomit: Secondary | ICD-10-CM | POA: Diagnosis not present

## 2017-09-15 NOTE — Progress Notes (Signed)
[facility] [place]   PULMONARY SERVICE  PROGRESS NOTE  Date of Service: 09/15/2017  Isaac Abbott [place]  ZOX:[id number]:[id number]  DOB: 07-12-1945   DOA: 08/10/2017  Referring Physician: Carron CurieAli Hijazi, MD  HPI: Isaac Abbott Andrzejewski is a 72 y.o. male seen for follow up of Acute on Chronic Respiratory Failure.  Patient remains on T collar is scheduled to be discharged for skilled nursing facility he is doing well at baseline  Medications: Reviewed on Rounds  Physical Exam:  Vitals: Temperature 98.0 pulse 78 respiratory rate 16 blood pressure 108/65 saturations 98%  Ventilator Settings off the ventilator on T collar  . General: Comfortable at this time . Eyes: Grossly normal lids, irises & conjunctiva . ENT: grossly tongue is normal . Neck: no obvious mass . Cardiovascular: S1 S2 normal no gallop . Respiratory:Peri [place].  Good air entry no rhonchi noted at this time . Abdomen: soft . Skin: no rash seen on limited exam . Musculoskeletal: not rigid . Psychiatric:unable to assess . Neurologic: no seizure no involuntary movements         Lab Data:   Basic Metabolic Panel: Recent Labs  Lab 09/11/17 0803 09/12/17 0547  NA 136 137  K 3.7 3.7  CL 98* 102  CO2 31 27  GLUCOSE 116* 142*  BUN 26* 24*  CREATININE 0.90 0.88  CALCIUM 8.8* 8.6*    Liver Function Tests: Recent Labs  Lab 09/12/17 0547  AST 32  ALT 113*  ALKPHOS 164*  BILITOT 0.7  PROT 5.4*  ALBUMIN 2.9*   No results for input(s): LIPASE, AMYLASE in the last 168 hours. No results for input(s): AMMONIA in the last 168 hours.  CBC: Recent Labs  Lab 09/11/17 0803  WBC 6.1  HGB 14.1  HCT 42.8  MCV 92.0  PLT 207    Cardiac Enzymes: No results for input(s): CKTOTAL, CKMB, CKMBINDEX, TROPONINI in the last 168 hours.  BNP (last 3 results) No results for input(s): BNP in the last 8760 hours.  ProBNP (last 3 results) No results for input(s): PROBNP in the last 8760 hours.  Radiological  Exams: No results found.  Assessment/Plan Active Problems:   Aspiration pneumonia (HCC)   Acute on chronic respiratory failure with hypoxia (HCC)   GERD (gastroesophageal reflux disease)   Stroke (cerebrum) (HCC)   Tracheostomy status (HCC)   1. Acute on chronic respiratory failure with hypoxia we will continue with T collar weaning.  Titrate oxygen as tolerated continue with aggressive pulmonary toilet. 2. Pneumonia due to aspiration clinically improved we will monitor. 3. GERD stable 4. Stroke continue with physical therapy 5. Tracheostomy will need to remain in place   I have personally seen and evaluated the patient, evaluated laboratory and imaging results, formulated the assessment and plan and placed orders. The Patient requires high complexity decision making for assessment and support.  Case was discussed on Rounds with the Respiratory Therapy Staff  Yevonne PaxSaadat A Jilliane Kazanjian, MD [facility] Pulmonary Critical Care Medicine Sleep Medicine

## 2017-09-16 DIAGNOSIS — J690 Pneumonitis due to inhalation of food and vomit: Secondary | ICD-10-CM | POA: Diagnosis not present

## 2017-09-16 DIAGNOSIS — Z930 Tracheostomy status: Secondary | ICD-10-CM | POA: Diagnosis not present

## 2017-09-16 DIAGNOSIS — K219 Gastro-esophageal reflux disease without esophagitis: Secondary | ICD-10-CM | POA: Diagnosis not present

## 2017-09-16 DIAGNOSIS — I6359 Cerebral infarction due to unspecified occlusion or stenosis of other cerebral artery: Secondary | ICD-10-CM | POA: Diagnosis not present

## 2017-09-16 DIAGNOSIS — J9621 Acute and chronic respiratory failure with hypoxia: Secondary | ICD-10-CM | POA: Diagnosis not present

## 2017-09-16 NOTE — Progress Notes (Signed)
[facility]   PULMONARY SERVICE  PROGRESS NOTE  Date of Service: 09/16/2017  Isaac Abbott  ZOX:[id number]  DOB: 07/16/1945   DOA: 08/10/2017  Referring Physician: Carron CurieAli Hijazi, MD  HPI: Isaac Abbott is a 72 y.o. male seen for follow up of Acute on Chronic Respiratory Failure.  Remains on T collar has copious thick amounts of secretions.  Patient is to go for a skilled nursing facility because of increased requirements of suctioning.  Medications: Reviewed on Rounds  Physical Exam:  Vitals: Temperature 97.6 pulse 77 respiratory rate 18 blood pressure 153/64 saturations 95%  Ventilator Settings off of the ventilator on T collar 28%  . General: Comfortable at this time . Eyes: Grossly normal lids, irises & conjunctiva . ENT: grossly tongue is normal . Neck: no obvious mass . Cardiovascular: S1 S2 normal no gallop . Respiratory: No rhonchi expansion is equal . Abdomen: soft . Skin: no rash seen on limited exam . Musculoskeletal: not rigid . Psychiatric:unable to assess . Neurologic: no seizure no involuntary movements         Lab Data:   Basic Metabolic Panel: Recent Labs  Lab 09/11/17 0803 09/12/17 0547  NA 136 137  K 3.7 3.7  CL 98* 102  CO2 31 27  GLUCOSE 116* 142*  BUN 26* 24*  CREATININE 0.90 0.88  CALCIUM 8.8* 8.6*    Liver Function Tests: Recent Labs  Lab 09/12/17 0547  AST 32  ALT 113*  ALKPHOS 164*  BILITOT 0.7  PROT 5.4*  ALBUMIN 2.9*   No results for input(s): LIPASE, AMYLASE in the last 168 hours. No results for input(s): AMMONIA in the last 168 hours.  CBC: Recent Labs  Lab 09/11/17 0803  WBC 6.1  HGB 14.1  HCT 42.8  MCV 92.0  PLT 207    Cardiac Enzymes: No results for input(s): CKTOTAL, CKMB, CKMBINDEX, TROPONINI in the last 168 hours.  BNP (last 3 results) No results for input(s): BNP in the last 8760 hours.  ProBNP (last 3 results) No results for input(s): PROBNP in the  last 8760 hours.  Radiological Exams: No results found.  Assessment/Plan Active Problems:   Aspiration pneumonia (HCC)   Acute on chronic respiratory failure with hypoxia (HCC)   GERD (gastroesophageal reflux disease)   Stroke (cerebrum) (HCC)   Tracheostomy status (HCC)   1. Acute on chronic respiratory failure with hypoxia we will continue with T collar trials continue pulmonary toilet supportive care patient's prognosis remains guarded at this time. 2. Pneumonia due to aspiration stable we will continue to follow 3. GERD at baseline we will continue present management 4. Stroke continue with supportive care 5. Tracheostomy 6. Will need to be continued   I have personally seen and evaluated the patient, evaluated laboratory and imaging results, formulated the assessment and plan and placed orders. The Patient requires high complexity decision making for assessment and support.  Case was discussed on Rounds with the Respiratory Therapy Staff  Yevonne PaxSaadat A Conny Situ, MD [facility] Pulmonary Critical Care Medicine Sleep Medicine

## 2017-09-17 DIAGNOSIS — J9621 Acute and chronic respiratory failure with hypoxia: Secondary | ICD-10-CM | POA: Diagnosis not present

## 2017-09-17 DIAGNOSIS — I6359 Cerebral infarction due to unspecified occlusion or stenosis of other cerebral artery: Secondary | ICD-10-CM | POA: Diagnosis not present

## 2017-09-17 DIAGNOSIS — K219 Gastro-esophageal reflux disease without esophagitis: Secondary | ICD-10-CM | POA: Diagnosis not present

## 2017-09-17 DIAGNOSIS — J690 Pneumonitis due to inhalation of food and vomit: Secondary | ICD-10-CM | POA: Diagnosis not present

## 2017-09-17 NOTE — Progress Notes (Signed)
[facility]   PULMONARY SERVICE  PROGRESS NOTE  Date of Service: 09/17/2017  Isaac RankinBarney Abbott  AOZ:[id number]  DOB: 02-22-1946   DOA: 08/10/2017  Referring Physician: Carron CurieAli Hijazi, MD  HPI: Isaac RankinBarney Abbott is a 72 y.o. male seen for follow up of Acute on Chronic Respiratory Failure.  Doing fine on T collar at baseline currently on 28% oxygen.  Good saturations are noted.  Denies having any specific complaints  Medications: Reviewed on Rounds  Physical Exam:  Vitals: Temperature 97.6 pulse 86 respiratory rate 18 blood pressure 115/73 saturations 100%  Ventilator Settings off the ventilator on T collar  . General: Comfortable at this time . Eyes: Grossly normal lids, irises & conjunctiva . ENT: grossly tongue is normal . Neck: no obvious mass . Cardiovascular: S1 S2 normal no gallop . Respiratory: Good air entry no rhonchi . Abdomen: soft . Skin: no rash seen on limited exam . Musculoskeletal: not rigid . Psychiatric:unable to assess . Neurologic: no seizure no involuntary movements         Lab Data:   Basic Metabolic Panel: Recent Labs  Lab 09/11/17 0803 09/12/17 0547  NA 136 137  K 3.7 3.7  CL 98* 102  CO2 31 27  GLUCOSE 116* 142*  BUN 26* 24*  CREATININE 0.90 0.88  CALCIUM 8.8* 8.6*    Liver Function Tests: Recent Labs  Lab 09/12/17 0547  AST 32  ALT 113*  ALKPHOS 164*  BILITOT 0.7  PROT 5.4*  ALBUMIN 2.9*   No results for input(s): LIPASE, AMYLASE in the last 168 hours. No results for input(s): AMMONIA in the last 168 hours.  CBC: Recent Labs  Lab 09/11/17 0803  WBC 6.1  HGB 14.1  HCT 42.8  MCV 92.0  PLT 207    Cardiac Enzymes: No results for input(s): CKTOTAL, CKMB, CKMBINDEX, TROPONINI in the last 168 hours.  BNP (last 3 results) No results for input(s): BNP in the last 8760 hours.  ProBNP (last 3 results) No results for input(s): PROBNP in the last 8760 hours.  Radiological Exams: No  results found.  Assessment/Plan Active Problems:   Aspiration pneumonia (HCC)   Acute on chronic respiratory failure with hypoxia (HCC)   GERD (gastroesophageal reflux disease)   Stroke (cerebrum) (HCC)   Tracheostomy status (HCC)   1. Acute on chronic respiratory failure with hypoxia we will continue with weaning on T collar patient is at baseline continue with aggressive pulmonary toilet supportive care 2. Pneumonia due to aspiration will continue present management. 3. Stroke we will continue supportive care 4. GERD at baseline 5. Tracheostomy will need to remain in place   I have personally seen and evaluated the patient, evaluated laboratory and imaging results, formulated the assessment and plan and placed orders. The Patient requires high complexity decision making for assessment and support.  Case was discussed on Rounds with the Respiratory Therapy Staff  Yevonne PaxSaadat A Aziah Kaiser, MD [facility] Pulmonary Critical Care Medicine Sleep Medicine

## 2017-09-18 DIAGNOSIS — Z930 Tracheostomy status: Secondary | ICD-10-CM | POA: Diagnosis not present

## 2017-09-18 DIAGNOSIS — I6359 Cerebral infarction due to unspecified occlusion or stenosis of other cerebral artery: Secondary | ICD-10-CM | POA: Diagnosis not present

## 2017-09-18 DIAGNOSIS — K219 Gastro-esophageal reflux disease without esophagitis: Secondary | ICD-10-CM | POA: Diagnosis not present

## 2017-09-18 DIAGNOSIS — J690 Pneumonitis due to inhalation of food and vomit: Secondary | ICD-10-CM | POA: Diagnosis not present

## 2017-09-18 DIAGNOSIS — J9621 Acute and chronic respiratory failure with hypoxia: Secondary | ICD-10-CM | POA: Diagnosis not present

## 2017-09-18 NOTE — Progress Notes (Signed)
[facility]   PULMONARY SERVICE  PROGRESS NOTE  Date of Service: 09/18/2017  Isaac Abbott  ZOX:[id number]  DOB: 29-Dec-1945   DOA: 08/10/2017  Referring Physician: Carron CurieAli Hijazi, MD  HPI: Isaac Abbott is a 72 y.o. male seen for follow up of Acute on Chronic Respiratory Failure.  Continues on T collar is at baseline no distress noted at this time.  Medications: Reviewed on Rounds  Physical Exam:  Vitals: Temperature 96.2 pulse 93 respiratory rate 20 blood pressure 133/77 saturations 97%  Ventilator Settings T collar trials 28% FiO2  . General: Comfortable at this time . Eyes: Grossly normal lids, irises & conjunctiva . ENT: grossly tongue is normal . Neck: no obvious mass . Cardiovascular: S1 S2 normal no gallop . Respiratory: No rhonchi or rales are noted . Abdomen: soft . Skin: no rash seen on limited exam . Musculoskeletal: not rigid . Psychiatric:unable to assess . Neurologic: no seizure no involuntary movements         Lab Data:   Basic Metabolic Panel: Recent Labs  Lab 09/12/17 0547  NA 137  K 3.7  CL 102  CO2 27  GLUCOSE 142*  BUN 24*  CREATININE 0.88  CALCIUM 8.6*    Liver Function Tests: Recent Labs  Lab 09/12/17 0547  AST 32  ALT 113*  ALKPHOS 164*  BILITOT 0.7  PROT 5.4*  ALBUMIN 2.9*   No results for input(s): LIPASE, AMYLASE in the last 168 hours. No results for input(s): AMMONIA in the last 168 hours.  CBC: No results for input(s): WBC, NEUTROABS, HGB, HCT, MCV, PLT in the last 168 hours.  Cardiac Enzymes: No results for input(s): CKTOTAL, CKMB, CKMBINDEX, TROPONINI in the last 168 hours.  BNP (last 3 results) No results for input(s): BNP in the last 8760 hours.  ProBNP (last 3 results) No results for input(s): PROBNP in the last 8760 hours.  Radiological Exams: No results found.  Assessment/Plan Active Problems:   Aspiration pneumonia (HCC)   Acute on chronic respiratory  failure with hypoxia (HCC)   GERD (gastroesophageal reflux disease)   Stroke (cerebrum) (HCC)   Tracheostomy status (HCC)   1. Acute on chronic respiratory failure with hypoxia we will continue with T collar weaning continue secretion management pulmonary toilet overall prognosis remains quite guarded at this time. 2. Aspiration pneumonia treated clinically improved 3. GERD stable 4. Stroke continue restorative therapy 5. Chronic tracheostomy will remain in place as noted previously   I have personally seen and evaluated the patient, evaluated laboratory and imaging results, formulated the assessment and plan and placed orders. The Patient requires high complexity decision making for assessment and support.  Case was discussed on Rounds with the Respiratory Therapy Staff  Yevonne PaxSaadat A Khan, MD [facility] Pulmonary Critical Care Medicine Sleep Medicine

## 2017-09-19 DIAGNOSIS — J9621 Acute and chronic respiratory failure with hypoxia: Secondary | ICD-10-CM | POA: Diagnosis not present

## 2017-09-19 DIAGNOSIS — I6359 Cerebral infarction due to unspecified occlusion or stenosis of other cerebral artery: Secondary | ICD-10-CM | POA: Diagnosis not present

## 2017-09-19 DIAGNOSIS — K219 Gastro-esophageal reflux disease without esophagitis: Secondary | ICD-10-CM | POA: Diagnosis not present

## 2017-09-19 DIAGNOSIS — J690 Pneumonitis due to inhalation of food and vomit: Secondary | ICD-10-CM | POA: Diagnosis not present

## 2017-09-19 LAB — COMPREHENSIVE METABOLIC PANEL
ALBUMIN: 3 g/dL — AB (ref 3.5–5.0)
ALK PHOS: 144 U/L — AB (ref 38–126)
ALT: 115 U/L — AB (ref 17–63)
AST: 40 U/L (ref 15–41)
Anion gap: 7 (ref 5–15)
BILIRUBIN TOTAL: 1 mg/dL (ref 0.3–1.2)
BUN: 28 mg/dL — AB (ref 6–20)
CALCIUM: 8.4 mg/dL — AB (ref 8.9–10.3)
CO2: 25 mmol/L (ref 22–32)
CREATININE: 0.85 mg/dL (ref 0.61–1.24)
Chloride: 105 mmol/L (ref 101–111)
GFR calc Af Amer: >60 mL/min (ref >60)
GFR calc non Af Amer: >60 mL/min (ref >60)
GLUCOSE: 85 mg/dL (ref 65–99)
Potassium: 3.9 mmol/L (ref 3.5–5.1)
Sodium: 137 mmol/L (ref 135–145)
Total Protein: 5.5 g/dL — ABNORMAL LOW (ref 6.5–8.1)

## 2017-09-19 NOTE — Progress Notes (Signed)
[facility]   PULMONARY SERVICE  PROGRESS NOTE  Date of Service: 09/19/2017  Isaac RankinBarney Abbott  WUJ:[id number]  DOB: 01/11/1946   DOA: 08/10/2017  Referring Physician: Carron CurieAli Hijazi, MD  HPI: Isaac RankinBarney Elsey is a 72 y.o. male seen for follow up of Acute on Chronic Respiratory Failure.  Patient urinalysis on T collar has been on 20% oxygen tolerating fairly well.  Right now temperature is 97.5 and saturations are 96%  Medications: Reviewed on Rounds  Physical Exam:  Vitals: Temperature 97.5 pulse 82 respiratory rate 22 blood pressure 155/82 saturations 96%  Ventilator Settings T collar 28%  . General: Comfortable at this time . Eyes: Grossly normal lids, irises & conjunctiva . ENT: grossly tongue is normal . Neck: no obvious mass . Cardiovascular: S1 S2 normal no gallop . Respiratory: Coarse breath sounds no rhonchi . Abdomen: soft . Skin: no rash seen on limited exam . Musculoskeletal: not rigid . Psychiatric:unable to assess . Neurologic: no seizure no involuntary movements         Lab Data:   Basic Metabolic Panel: Recent Labs  Lab 09/19/17 0636  NA 137  K 3.9  CL 105  CO2 25  GLUCOSE 85  BUN 28*  CREATININE 0.85  CALCIUM 8.4*    Liver Function Tests: Recent Labs  Lab 09/19/17 0636  AST 40  ALT 115*  ALKPHOS 144*  BILITOT 1.0  PROT 5.5*  ALBUMIN 3.0*   No results for input(s): LIPASE, AMYLASE in the last 168 hours. No results for input(s): AMMONIA in the last 168 hours.  CBC: No results for input(s): WBC, NEUTROABS, HGB, HCT, MCV, PLT in the last 168 hours.  Cardiac Enzymes: No results for input(s): CKTOTAL, CKMB, CKMBINDEX, TROPONINI in the last 168 hours.  BNP (last 3 results) No results for input(s): BNP in the last 8760 hours.  ProBNP (last 3 results) No results for input(s): PROBNP in the last 8760 hours.  Radiological Exams: No results found.  Assessment/Plan Active Problems:    Aspiration pneumonia (HCC)   Acute on chronic respiratory failure with hypoxia (HCC)   GERD (gastroesophageal reflux disease)   Stroke (cerebrum) (HCC)   Tracheostomy status (HCC)   1. Acute on chronic respiratory failure with hypoxia we will continue discharge planning prescriptions are fair to moderate continue pulmonary toilet supportive care. 2. Pneumonia due to aspiration treated we will continue to follow 3. GERD stable 4. Stroke at baseline 5. Tracheostomy will remain in place   I have personally seen and evaluated the patient, evaluated laboratory and imaging results, formulated the assessment and plan and placed orders. The Patient requires high complexity decision making for assessment and support.  Case was discussed on Rounds with the Respiratory Therapy Staff  Yevonne PaxSaadat A Khan, MD [facility] Pulmonary Critical Care Medicine Sleep Medicine

## 2019-08-27 IMAGING — DX DG CHEST 1V PORT
1 series · 1 of 1 positions shown · non-contrast
Comparison: None.

CLINICAL DATA: Shortness of breath, congestion

EXAM:
PORTABLE CHEST 1 VIEW

[chest ap]
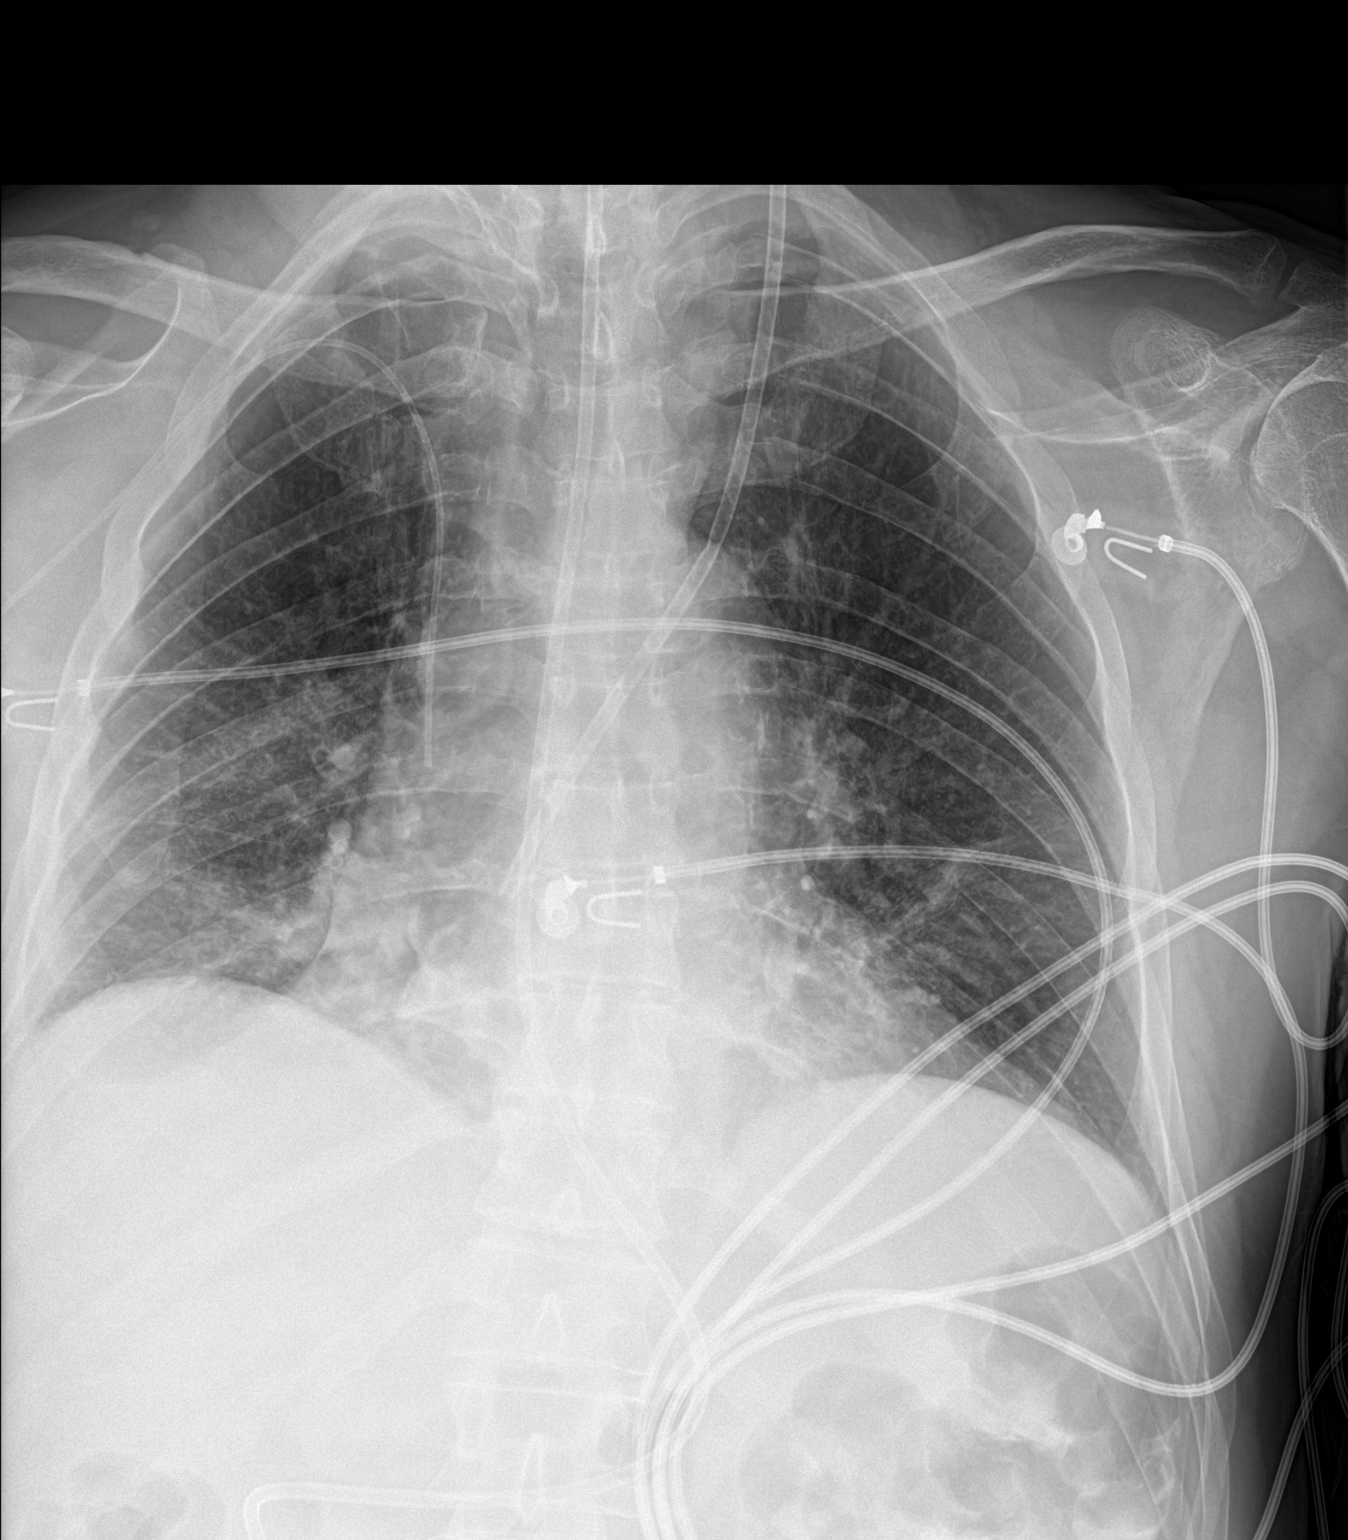

[1 of 1 positions shown; findings below may reference images not displayed]

FINDINGS: Mild bilateral lower lobe opacities, likely atelectasis. No frank
interstitial edema. No pleural effusion or pneumothorax.

Cardiomegaly.

Right arm PICC terminates at the cavoatrial junction.

Enteric tube courses into the duodenum. A catheter/tube overlies the
left chest, this is favored to reflect the external portion of the
enteric tube.
IMPRESSION: Mild bilateral lower lobe opacities, likely atelectasis.

## 2019-08-27 IMAGING — DX DG ABD PORTABLE 1V
1 series · 1 of 1 positions shown · non-contrast
Comparison: None.

CLINICAL DATA: Evaluate NG tube position

EXAM:
PORTABLE ABDOMEN - 1 VIEW

[abdomen kub]
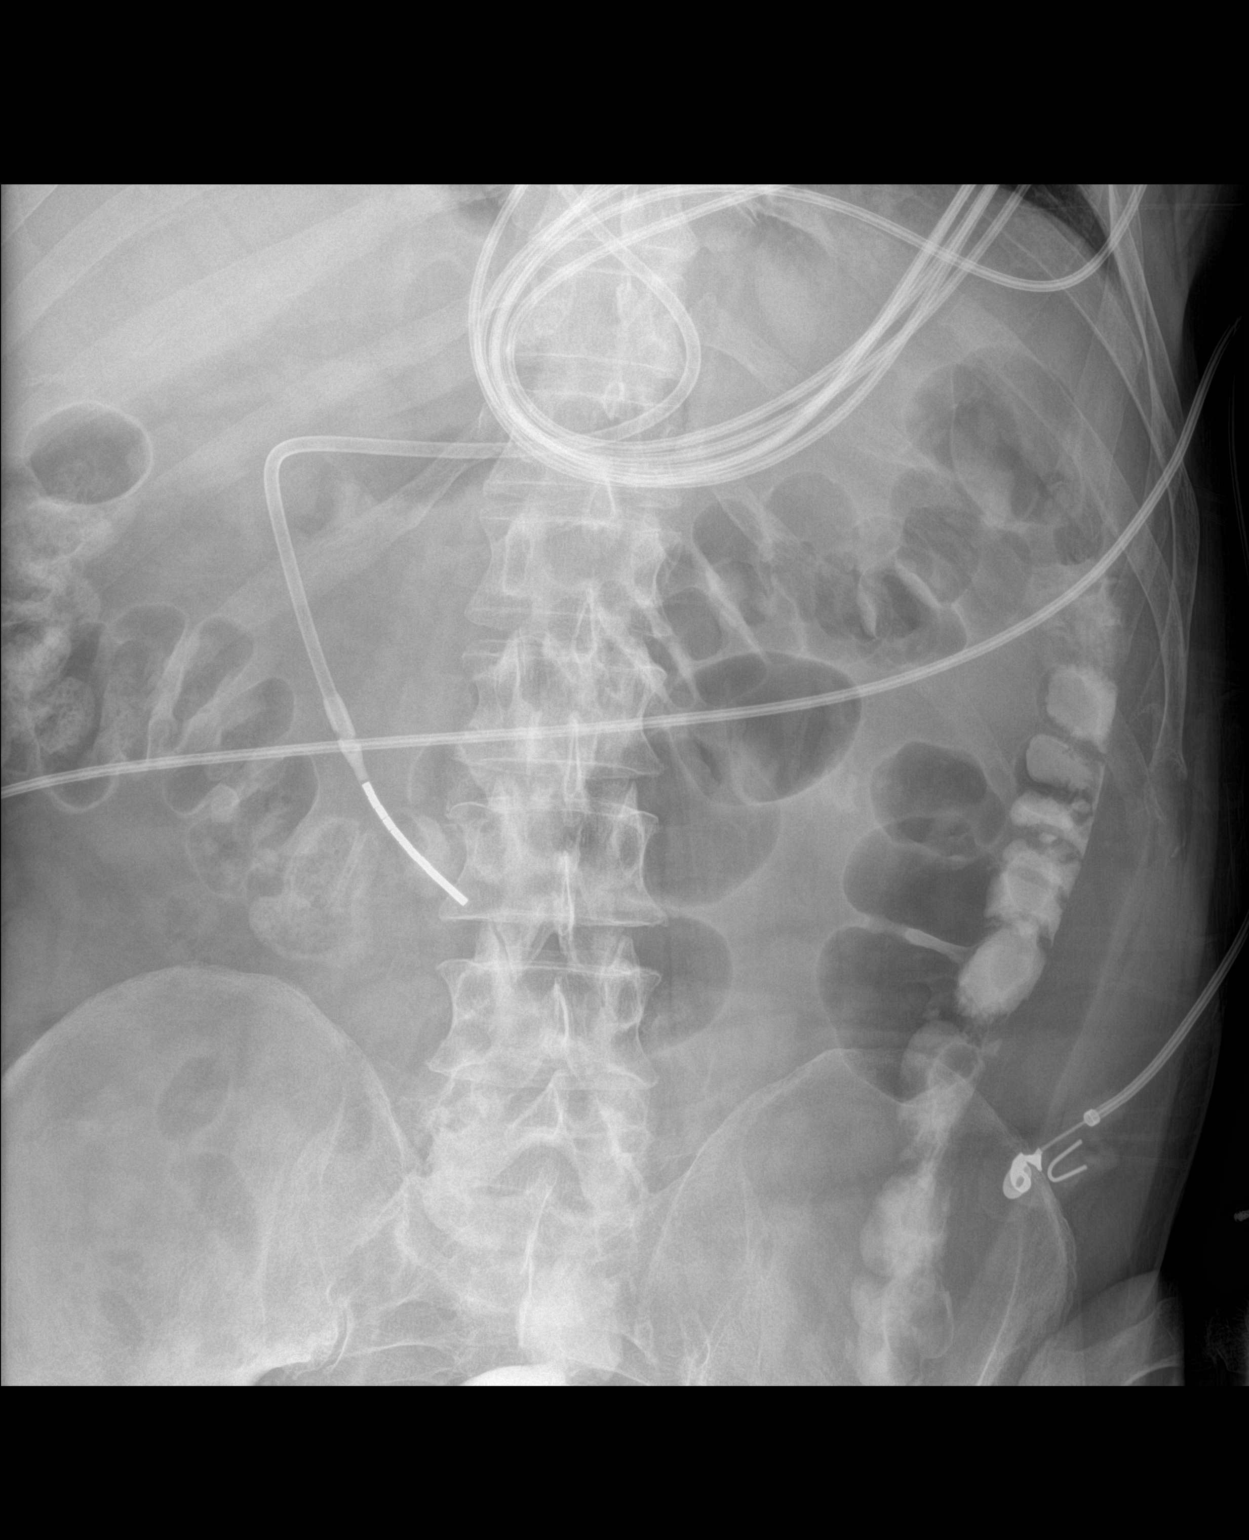

[1 of 1 positions shown; findings below may reference images not displayed]

FINDINGS: Enteric tube terminates in the proximal 3rd portion of the duodenum.

Nonobstructive bowel gas pattern.

Contrast in the left colon.
IMPRESSION: Enteric tube terminates in the proximal 3rd portion of the duodenum.

## 2019-08-30 IMAGING — DX DG CHEST 1V PORT
1 series · 1 of 1 positions shown · non-contrast
Comparison: 08/10/2017

CLINICAL DATA: PICC line placement

EXAM:
PORTABLE CHEST 1 VIEW

[chest ap]
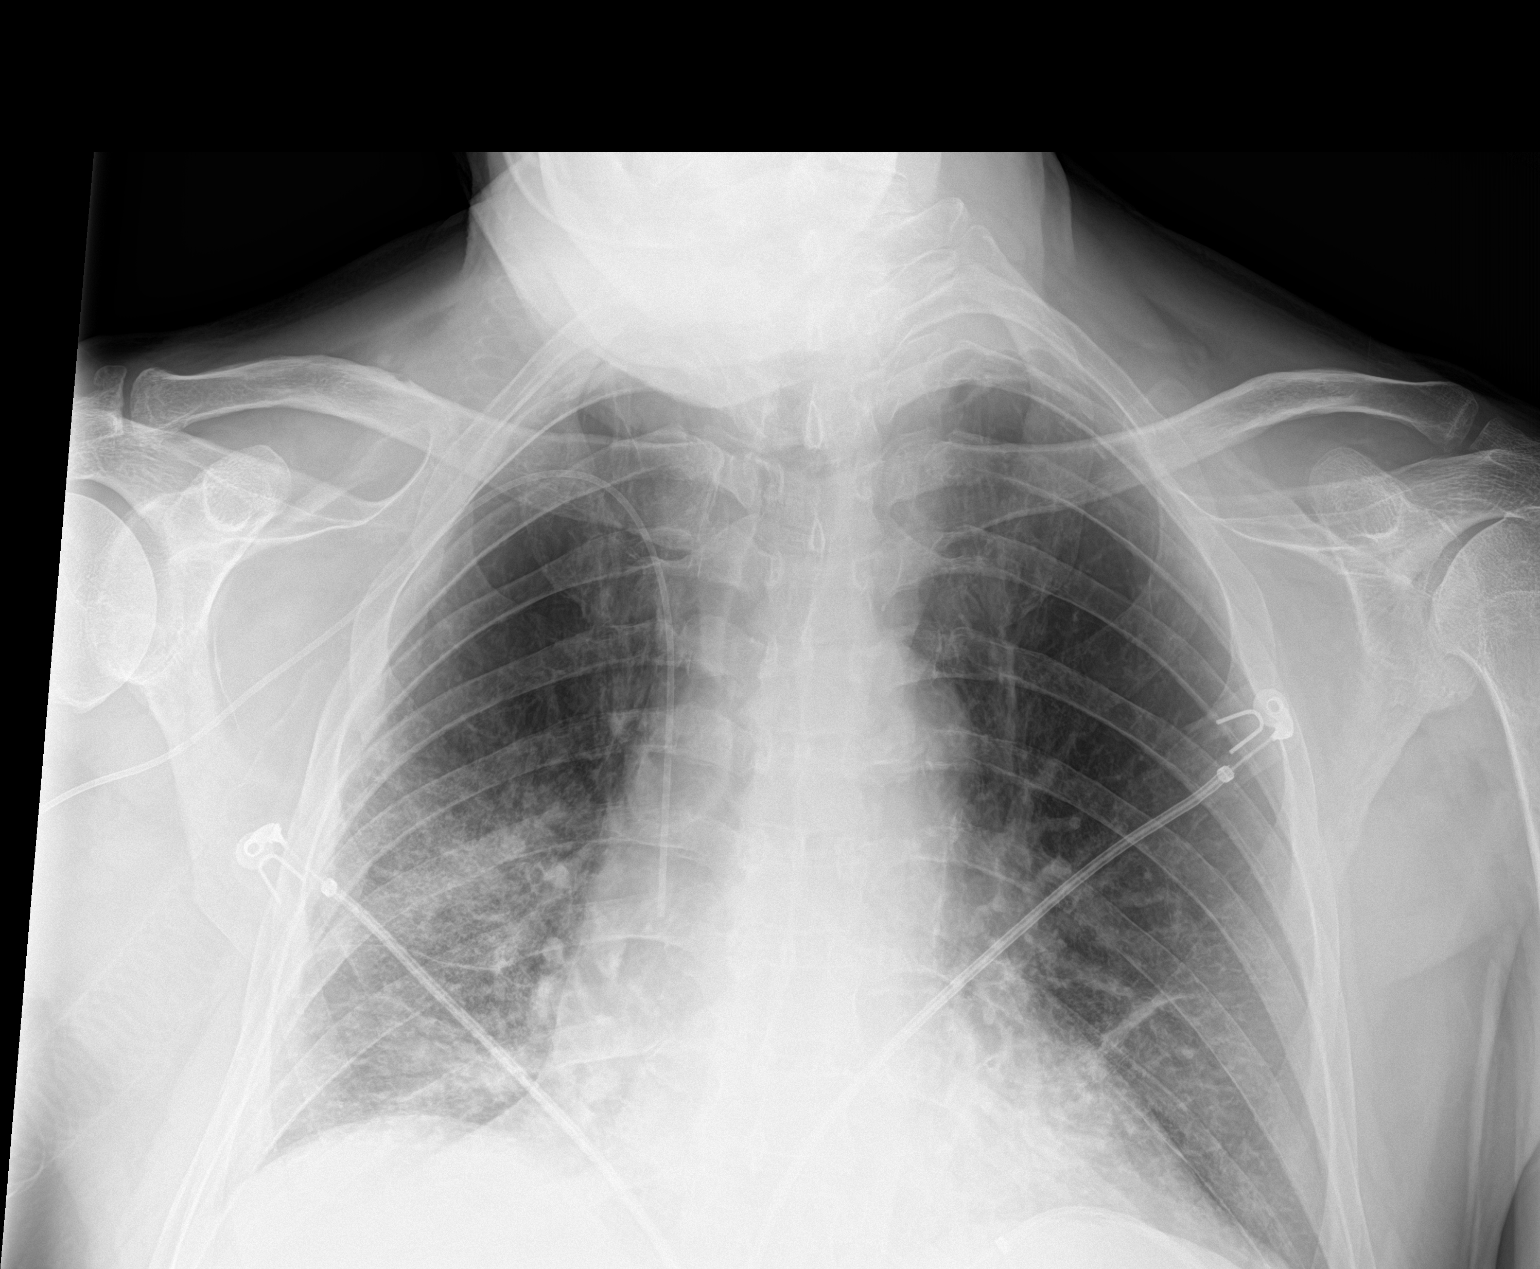

[1 of 1 positions shown; findings below may reference images not displayed]

FINDINGS: Right upper extremity central venous catheter tip overlies the
cavoatrial region. Removal of esophageal tube and left-sided central
venous catheter. Stable enlarged cardiomediastinal silhouette.
Patchy right greater than left lower lung infiltrates. Aortic
atherosclerosis. No pneumothorax.
IMPRESSION: 1. Right upper extremity catheter tip overlies the cavoatrial region
2. Right greater than left bilateral lower lung pulmonary
infiltrates.

## 2019-09-01 IMAGING — CT CT ABDOMEN W/O CM
3 of 4 series · 11 of 46 positions shown, 16 images · non-contrast
Comparison: None.

CLINICAL DATA: 71-year-old male with dysphagia and suspected
aspiration undergoing evaluation for possible percutaneous
gastrostomy tube placement. Evaluate anatomy.

EXAM:
CT ABDOMEN WITHOUT CONTRAST
TECHNIQUE: Multidetector CT imaging of the abdomen was performed following the
standard protocol without IV contrast.

[Series 3: ap without · axial · non-contrast · 0.81mm/px · z∈[-14,+201]mm · 7 of 59 slices shown, 12 images]
[im 8/59  soft-tissue]
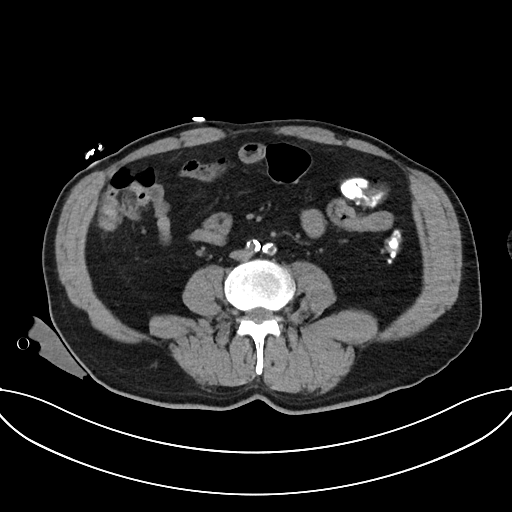
[im 8/59  bone]
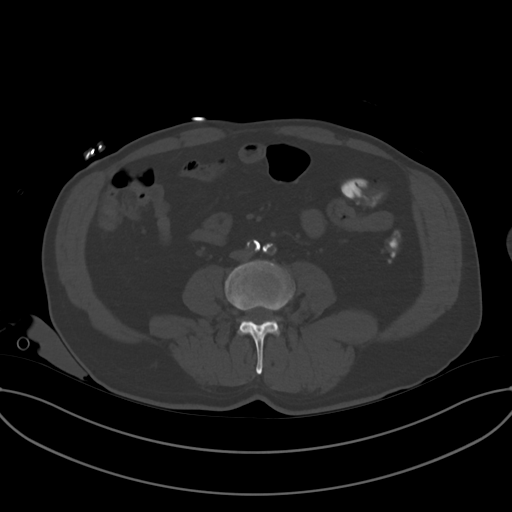
[im 15/59  soft-tissue]
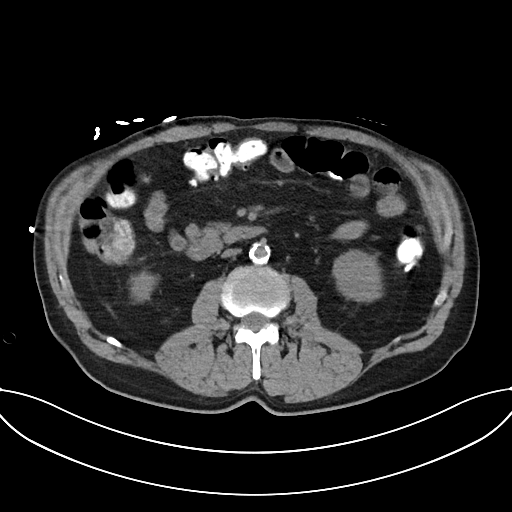
[im 22/59  soft-tissue]
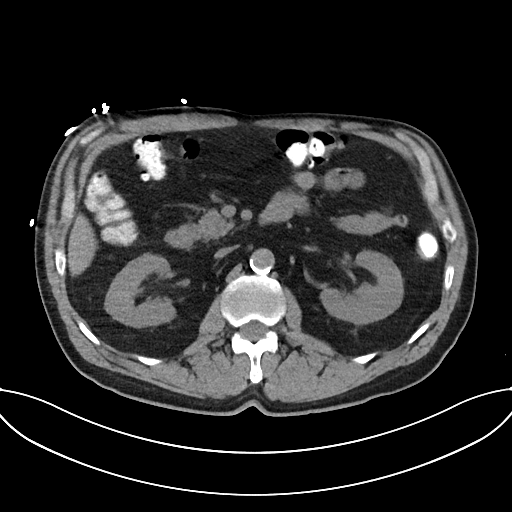
[im 30/59  soft-tissue]
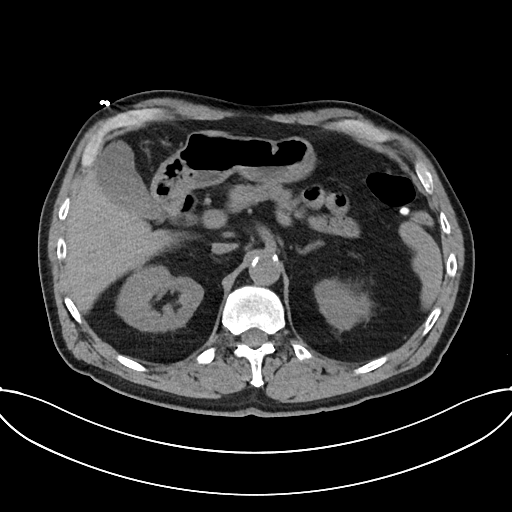
[im 30/59  lung]
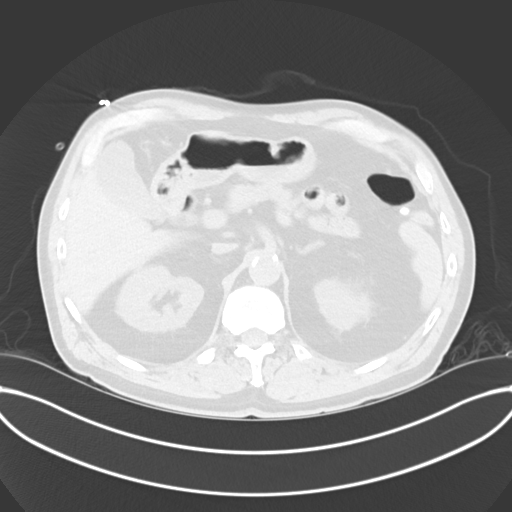
[im 37/59  soft-tissue]
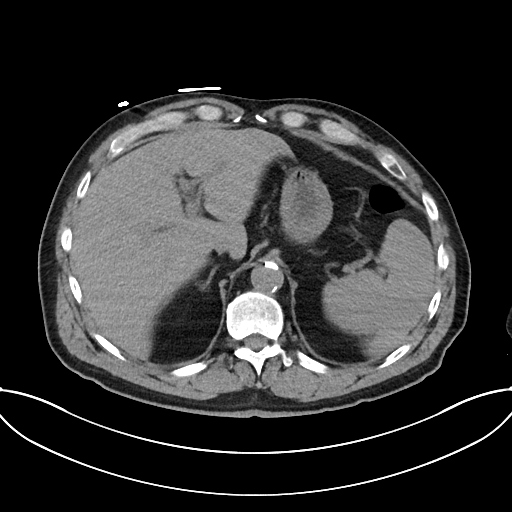
[im 37/59  lung]
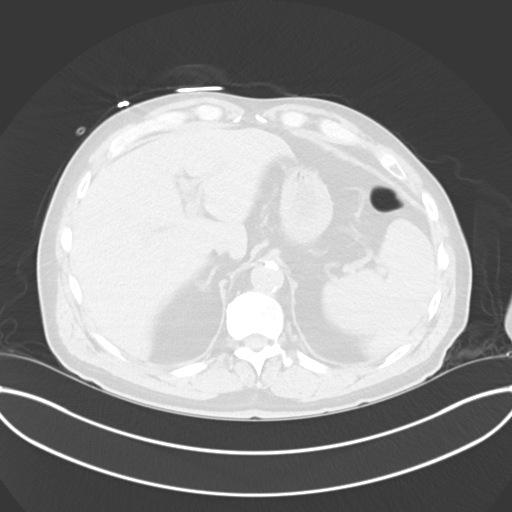
[im 44/59  soft-tissue]
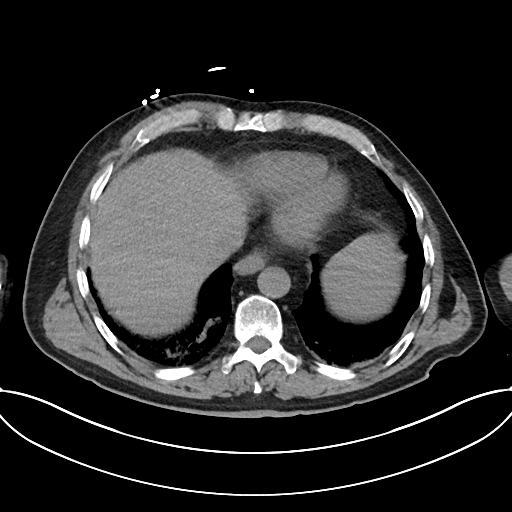
[im 44/59  lung]
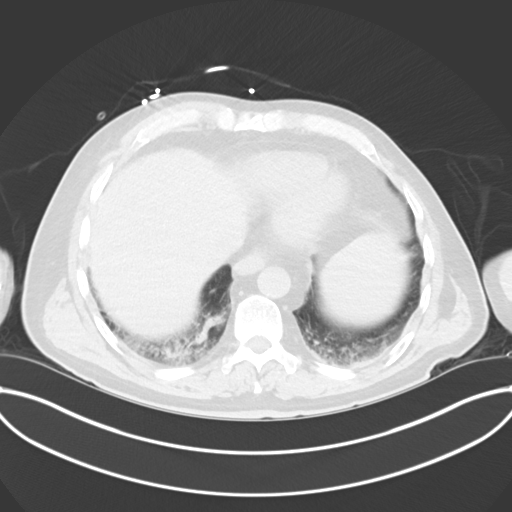
[im 51/59  soft-tissue]
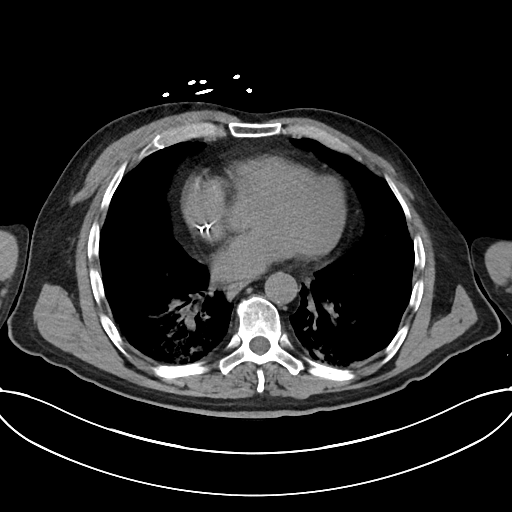
[im 51/59  lung]
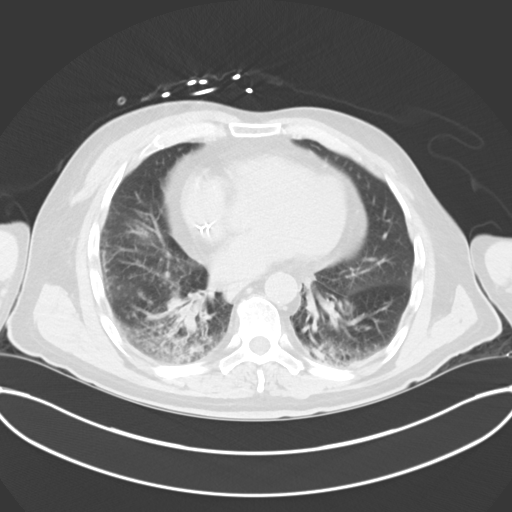

[Series 6: cor · coronal · 0.58mm/px · 3 of 92 slices shown]
[im 31/92  soft-tissue]
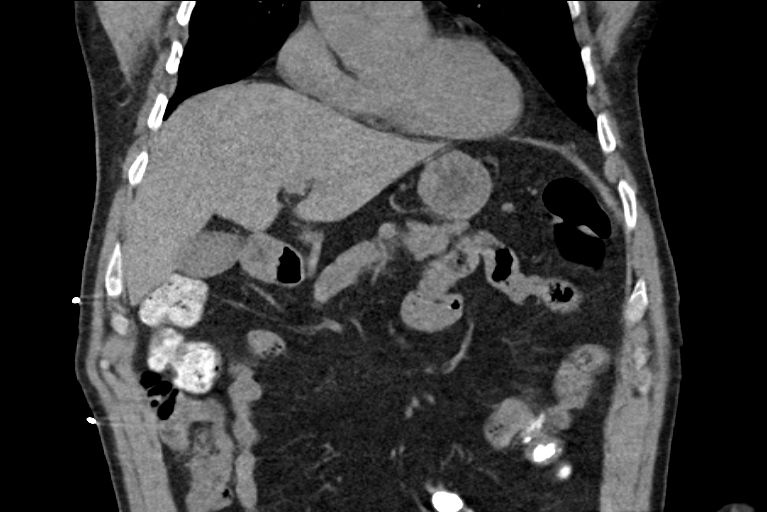
[im 41/92  soft-tissue]
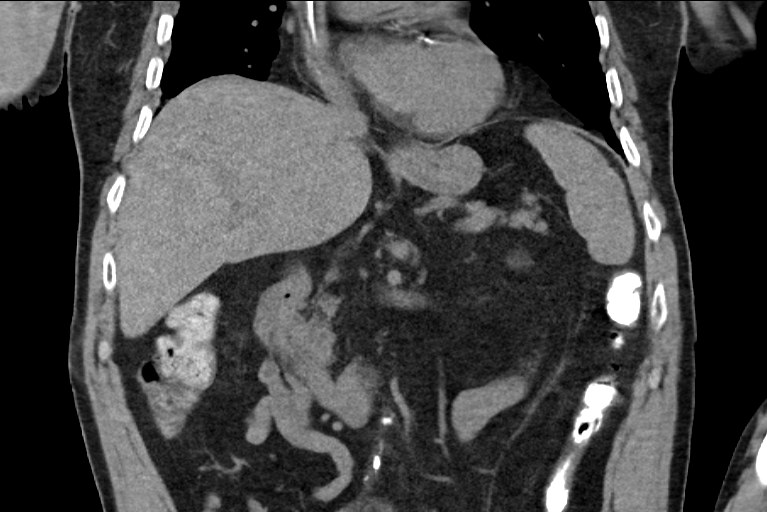
[im 51/92  soft-tissue]
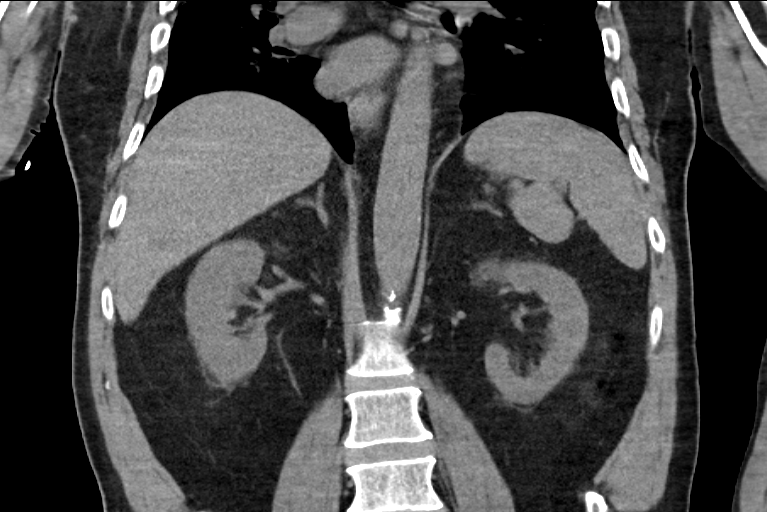

[Series 7: sag · sagittal · 0.58mm/px · 1 of 125 slices shown]
[im 42/125  soft-tissue]
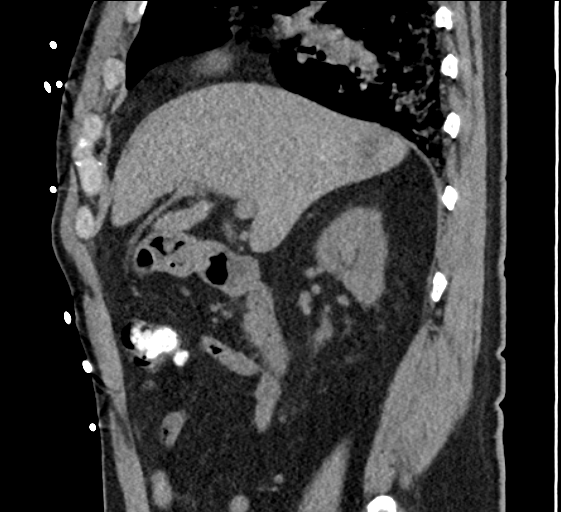

[11 of 46 positions shown; findings below may reference images not displayed]

FINDINGS: Lower chest: Calcifications are present throughout the coronary
arteries. The heart is normal in size. No pericardial effusion.
Small hiatal hernia. Debris is present within the bronchus
intermedius. Additionally, there are patchy areas of secretions
and/or debris within the right lower lobe segmental bronchi. Patchy
airspace opacity is present in the posterior aspect of the right
upper, middle and lower lobes and to a lesser degree in the
posterior aspect of the left lower lobe. Findings are concerning for
aspiration. No suspicious pulmonary mass or nodule. No significant
pleural effusion.

Hepatobiliary: Normal hepatic contour and morphology. No discrete
hepatic lesions. Normal appearance of the gallbladder. No intra or
extrahepatic biliary ductal dilatation.

Pancreas: Unremarkable. No pancreatic ductal dilatation or
surrounding inflammatory changes.

Spleen: Normal in size without focal abnormality.

Adrenals/Urinary Tract: Adrenal glands are unremarkable. Kidneys are
normal, without renal calculi, focal lesion, or hydronephrosis.

Stomach/Bowel: Normal gastric anatomy. Small periampullary duodenal
diverticulum. Colonic diverticular disease without CT evidence of
active inflammation. Normal position of the transverse colon
inferior to the stomach.

Vascular/Lymphatic: Limited evaluation in the absence of intravenous
contrast. Atherosclerotic calcifications throughout the abdominal
aorta. Suspect bilateral, left greater than right common iliac
stenosis. No evidence of aneurysm. No suspicious lymphadenopathy.

Other: No abdominal wall hernia or abnormality.

Musculoskeletal: No acute or significant osseous findings.
IMPRESSION: 1. Anatomy is amenable to percutaneous gastrostomy tube placement.
2. Multifocal patchy airspace opacity and bronchial debris,
particularly in the right lower lobe concerning for aspiration.
3. Colonic diverticular disease without CT evidence of active
inflammation.
4. Coronary artery calcifications.
5.  Aortic Atherosclerosis ([id number])
6. Suspect bilateral, left greater than right, common iliac artery
stenoses.

## 2019-09-04 IMAGING — DX DG ABD PORTABLE 1V
1 series · 1 of 1 positions shown · non-contrast
Comparison: 08/10/2017

CLINICAL DATA: Abdominal discomfort and pain, RIGHT upper quadrant
pain

EXAM:
PORTABLE ABDOMEN - 1 VIEW

[abdomen]
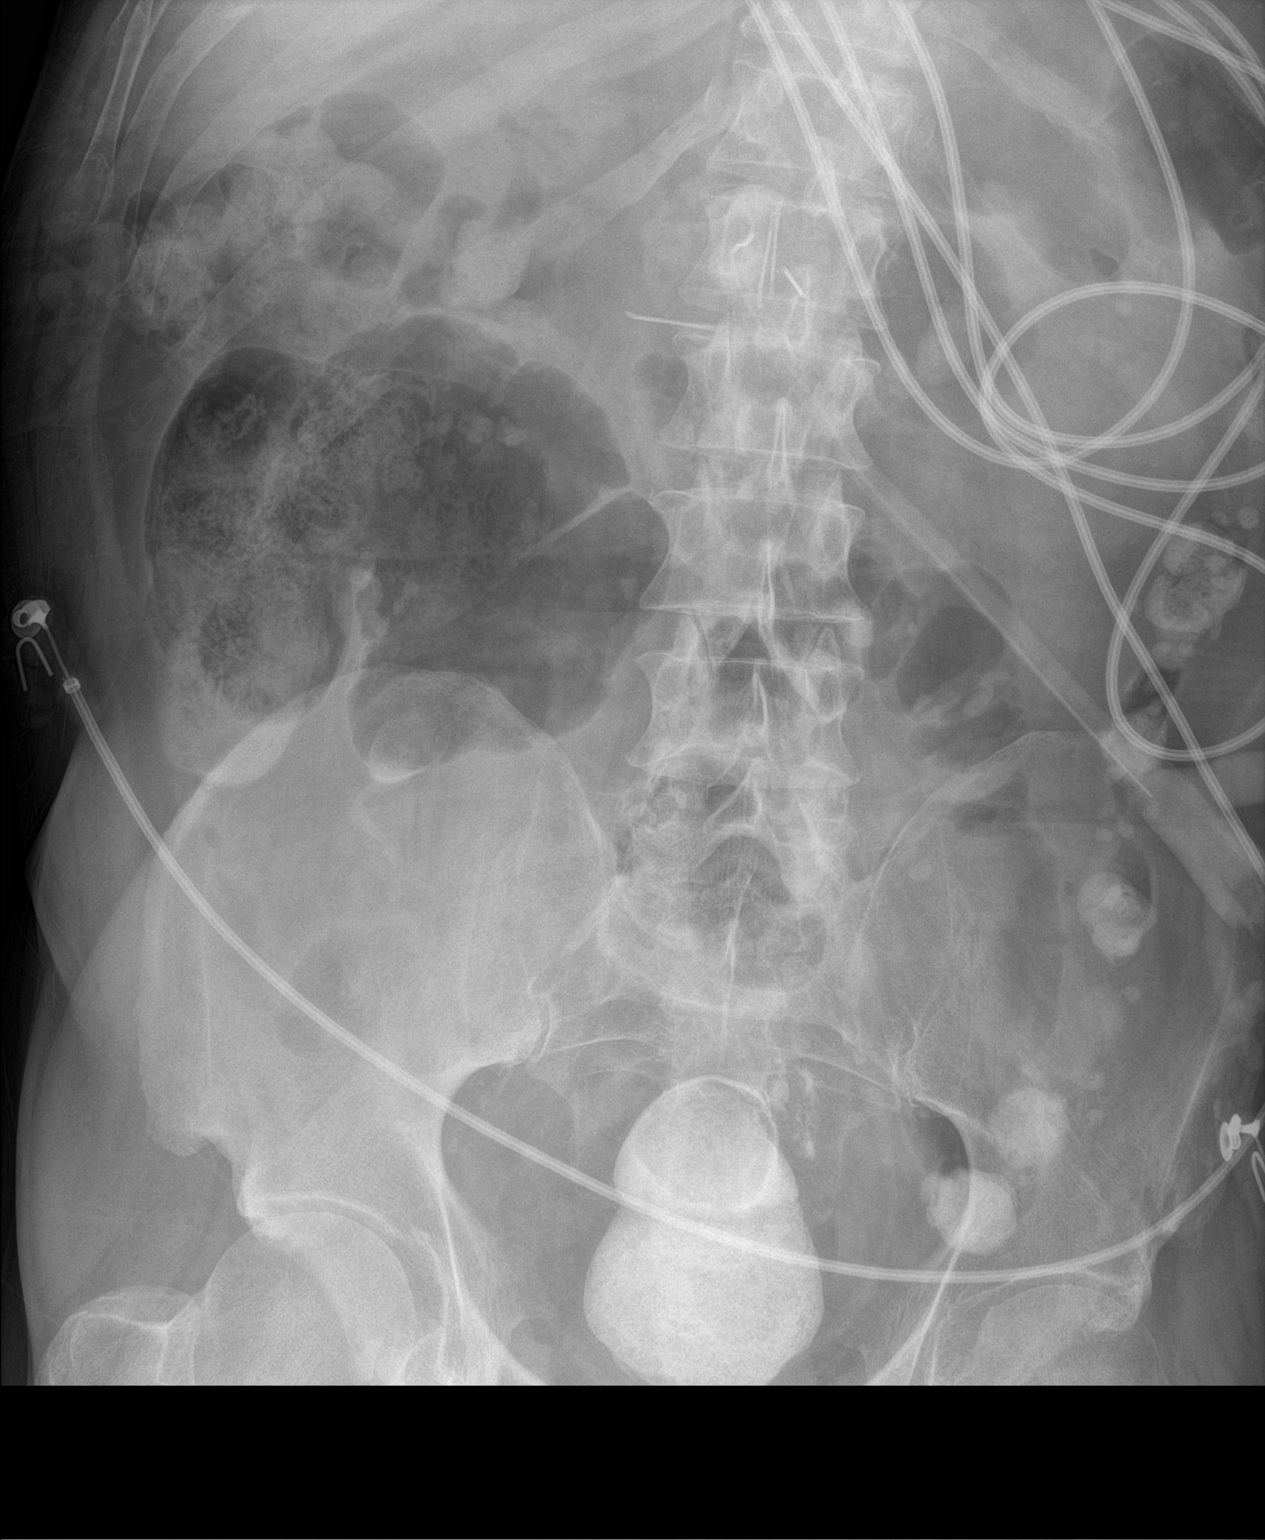

[1 of 1 positions shown; findings below may reference images not displayed]

FINDINGS: Retained contrast in colon.

Gaseous distension of ascending colon/cecum up to 10 cm diameter.

Remainder of colon normal caliber with slightly prominent stool in
ascending colon.

Small bowel gas pattern normal.

No bowel wall thickening or evidence of obstruction.

Bones demineralized.
IMPRESSION: Mild gaseous distension of the cecum with minimally prominent stool
in ascending colon.

## 2021-09-10 DEATH — deceased
